# Patient Record
Sex: Female | Born: 1990 | Race: White | Hispanic: No | Marital: Married | Smoking: Never smoker
Health system: Southern US, Community
[De-identification: ages and names within clinical notes are randomized; demographics above are authoritative.]

## PROBLEM LIST (undated history)

## (undated) DIAGNOSIS — G43829 Menstrual migraine, not intractable, without status migrainosus: Secondary | ICD-10-CM

## (undated) DIAGNOSIS — Q0700 Arnold-Chiari syndrome without spina bifida or hydrocephalus: Secondary | ICD-10-CM

## (undated) DIAGNOSIS — D696 Thrombocytopenia, unspecified: Secondary | ICD-10-CM

## (undated) DIAGNOSIS — E221 Hyperprolactinemia: Secondary | ICD-10-CM

## (undated) DIAGNOSIS — I10 Essential (primary) hypertension: Secondary | ICD-10-CM

## (undated) DIAGNOSIS — O99119 Other diseases of the blood and blood-forming organs and certain disorders involving the immune mechanism complicating pregnancy, unspecified trimester: Secondary | ICD-10-CM

## (undated) DIAGNOSIS — F419 Anxiety disorder, unspecified: Secondary | ICD-10-CM

## (undated) HISTORY — DX: Thrombocytopenia, unspecified: O99.119

## (undated) HISTORY — DX: Essential (primary) hypertension: I10

## (undated) HISTORY — DX: Anxiety disorder, unspecified: F41.9

## (undated) HISTORY — DX: Thrombocytopenia, unspecified: D69.6

## (undated) HISTORY — DX: Menstrual migraine, not intractable, without status migrainosus: G43.829

## (undated) HISTORY — DX: Hyperprolactinemia: E22.1

## (undated) HISTORY — DX: Other diseases of the blood and blood-forming organs and certain disorders involving the immune mechanism complicating pregnancy, unspecified trimester: D69.6

## (undated) HISTORY — PX: NO PAST SURGERIES: SHX2092

## (undated) HISTORY — DX: Arnold-Chiari syndrome without spina bifida or hydrocephalus: Q07.00

---

## 2013-01-21 DIAGNOSIS — R609 Edema, unspecified: Secondary | ICD-10-CM

## 2016-02-23 LAB — OB RESULTS CONSOLE GC/CHLAMYDIA
CHLAMYDIA, DNA PROBE: NEGATIVE
Gonorrhea: NEGATIVE

## 2016-04-13 LAB — OB RESULTS CONSOLE RUBELLA ANTIBODY, IGM: Rubella: IMMUNE

## 2016-04-13 LAB — OB RESULTS CONSOLE HIV ANTIBODY (ROUTINE TESTING): HIV: NONREACTIVE

## 2016-04-13 LAB — OB RESULTS CONSOLE HEPATITIS B SURFACE ANTIGEN: Hepatitis B Surface Ag: NEGATIVE

## 2016-04-13 LAB — OB RESULTS CONSOLE RPR: RPR: NONREACTIVE

## 2016-07-16 NOTE — L&D Delivery Note (Signed)
Delivery Note After controlled pushing, at crowing vaginal internal bleeding was noted. Perineal band was getting thicker instead of thinning, I was concerned about vaginal sulcus tear due to very tight introitus not stretching with pushing. Reviewed episiotomy and agrees. Local infiltrated and 2 cm incision made midline and perineal support continued and baby delivered with next push.  At 7:11 PM a viable and healthy female was delivered via Vaginal, Spontaneous Delivery (Presentation:OA ).  APGAR: 7, 8; weight 7 lb 13 oz (3545 g).   Placenta status: complete and spontaneous.  Cord:  with the following complications: loose nuchal x1, reduced after head delivery. Cord pH: N/A  Anesthesia:  Epidural and Pudendal block.  Episiotomy: Median (no extension) Lacerations: Right vaginal to Sulcus and left Vaginal 3 cm./  Suture Repair: 3.0 vicryl rapide Est. Blood Loss (mL): 400  Mom to postpartum.  Baby to Couplet care / Skin to Skin.  Shaquitta Burbridge R 10/10/2016, 7:58 PM

## 2016-10-05 LAB — OB RESULTS CONSOLE GBS: STREP GROUP B AG: NEGATIVE

## 2016-10-10 ENCOUNTER — Inpatient Hospital Stay (HOSPITAL_COMMUNITY): Payer: 59 | Admitting: Anesthesiology

## 2016-10-10 ENCOUNTER — Encounter (HOSPITAL_COMMUNITY): Payer: Self-pay

## 2016-10-10 ENCOUNTER — Inpatient Hospital Stay (HOSPITAL_COMMUNITY)
Admission: AD | Admit: 2016-10-10 | Discharge: 2016-10-13 | DRG: 775 | Disposition: A | Discharge status: Home / Self Care | Payer: 59 | Source: Ambulatory Visit | Attending: Obstetrics & Gynecology | Admitting: Obstetrics & Gynecology

## 2016-10-10 DIAGNOSIS — D6959 Other secondary thrombocytopenia: Secondary | ICD-10-CM | POA: Diagnosis present

## 2016-10-10 DIAGNOSIS — D62 Acute posthemorrhagic anemia: Secondary | ICD-10-CM | POA: Diagnosis not present

## 2016-10-10 DIAGNOSIS — O9912 Other diseases of the blood and blood-forming organs and certain disorders involving the immune mechanism complicating childbirth: Secondary | ICD-10-CM | POA: Diagnosis present

## 2016-10-10 DIAGNOSIS — O139 Gestational [pregnancy-induced] hypertension without significant proteinuria, unspecified trimester: Secondary | ICD-10-CM | POA: Diagnosis present

## 2016-10-10 DIAGNOSIS — O9081 Anemia of the puerperium: Secondary | ICD-10-CM | POA: Diagnosis not present

## 2016-10-10 DIAGNOSIS — O1404 Mild to moderate pre-eclampsia, complicating childbirth: Secondary | ICD-10-CM | POA: Diagnosis present

## 2016-10-10 DIAGNOSIS — Z3A36 36 weeks gestation of pregnancy: Secondary | ICD-10-CM

## 2016-10-10 DIAGNOSIS — J069 Acute upper respiratory infection, unspecified: Secondary | ICD-10-CM | POA: Diagnosis present

## 2016-10-10 DIAGNOSIS — D696 Thrombocytopenia, unspecified: Secondary | ICD-10-CM | POA: Diagnosis present

## 2016-10-10 DIAGNOSIS — O4290 Premature rupture of membranes, unspecified as to length of time between rupture and onset of labor, unspecified weeks of gestation: Secondary | ICD-10-CM | POA: Diagnosis present

## 2016-10-10 DIAGNOSIS — O9952 Diseases of the respiratory system complicating childbirth: Secondary | ICD-10-CM | POA: Diagnosis present

## 2016-10-10 DIAGNOSIS — O42913 Preterm premature rupture of membranes, unspecified as to length of time between rupture and onset of labor, third trimester: Secondary | ICD-10-CM | POA: Diagnosis present

## 2016-10-10 LAB — CBC
HCT: 37.4 % (ref 36.0–46.0)
HCT: 34.7 % — ABNORMAL LOW (ref 36.0–46.0)
HCT: 34.5 % — ABNORMAL LOW (ref 36.0–46.0)
Hemoglobin: 13 g/dL (ref 12.0–15.0)
Hemoglobin: 12.2 g/dL (ref 12.0–15.0)
Hemoglobin: 12.1 g/dL (ref 12.0–15.0)
MCH: 29.7 pg (ref 26.0–34.0)
MCH: 30 pg (ref 26.0–34.0)
MCH: 29.9 pg (ref 26.0–34.0)
MCHC: 34.8 g/dL (ref 30.0–36.0)
MCHC: 35.2 g/dL (ref 30.0–36.0)
MCHC: 35.1 g/dL (ref 30.0–36.0)
MCV: 85.6 fL (ref 78.0–100.0)
MCV: 85.5 fL (ref 78.0–100.0)
MCV: 85.2 fL (ref 78.0–100.0)
PLATELETS: 114 10*3/uL — AB (ref 150–400)
Platelets: 116 10*3/uL — ABNORMAL LOW (ref 150–400)
Platelets: 128 10*3/uL — ABNORMAL LOW (ref 150–400)
RBC: 4.37 MIL/uL (ref 3.87–5.11)
RBC: 4.06 MIL/uL (ref 3.87–5.11)
RBC: 4.05 MIL/uL (ref 3.87–5.11)
RDW: 14.5 % (ref 11.5–15.5)
RDW: 14.4 % (ref 11.5–15.5)
RDW: 14.5 % (ref 11.5–15.5)
WBC: 8.3 10*3/uL (ref 4.0–10.5)
WBC: 9.3 10*3/uL (ref 4.0–10.5)
WBC: 17.6 10*3/uL — AB (ref 4.0–10.5)

## 2016-10-10 LAB — POCT FERN TEST: POCT FERN TEST: POSITIVE

## 2016-10-10 LAB — COMPREHENSIVE METABOLIC PANEL
ALBUMIN: 2.9 g/dL — AB (ref 3.5–5.0)
ALK PHOS: 107 U/L (ref 38–126)
ALT: 15 U/L (ref 14–54)
ALT: 18 U/L (ref 14–54)
AST: 22 U/L (ref 15–41)
AST: 54 U/L — AB (ref 15–41)
Albumin: 2.5 g/dL — ABNORMAL LOW (ref 3.5–5.0)
Alkaline Phosphatase: 89 U/L (ref 38–126)
Anion gap: 15 (ref 5–15)
Anion gap: 10 (ref 5–15)
BUN: 9 mg/dL (ref 6–20)
BUN: 6 mg/dL (ref 6–20)
CALCIUM: 9 mg/dL (ref 8.9–10.3)
CHLORIDE: 108 mmol/L (ref 101–111)
CHLORIDE: 109 mmol/L (ref 101–111)
CO2: 16 mmol/L — ABNORMAL LOW (ref 22–32)
CO2: 20 mmol/L — AB (ref 22–32)
Calcium: 8.5 mg/dL — ABNORMAL LOW (ref 8.9–10.3)
Creatinine, Ser: 0.56 mg/dL (ref 0.44–1.00)
Creatinine, Ser: 0.75 mg/dL (ref 0.44–1.00)
GFR calc non Af Amer: >60 mL/min (ref >60)
GLUCOSE: 77 mg/dL (ref 65–99)
Glucose, Bld: 116 mg/dL — ABNORMAL HIGH (ref 65–99)
POTASSIUM: 3.1 mmol/L — AB (ref 3.5–5.1)
Potassium: 3.5 mmol/L (ref 3.5–5.1)
SODIUM: 139 mmol/L (ref 135–145)
Sodium: 139 mmol/L (ref 135–145)
Total Bilirubin: 0.6 mg/dL (ref 0.3–1.2)
Total Bilirubin: 0.5 mg/dL (ref 0.3–1.2)
Total Protein: 6.2 g/dL — ABNORMAL LOW (ref 6.5–8.1)
Total Protein: 5.8 g/dL — ABNORMAL LOW (ref 6.5–8.1)

## 2016-10-10 LAB — ABO/RH: ABO/RH(D): O POS

## 2016-10-10 LAB — URIC ACID: Uric Acid, Serum: 6.1 mg/dL (ref 2.3–6.6)

## 2016-10-10 LAB — RPR: RPR Ser Ql: NONREACTIVE

## 2016-10-10 LAB — TYPE AND SCREEN
ABO/RH(D): O POS
ANTIBODY SCREEN: NEGATIVE

## 2016-10-10 MED ORDER — SIMETHICONE 80 MG PO CHEW: 80.0000 mg | CHEWABLE_TABLET | ORAL | Status: DC | PRN

## 2016-10-10 MED ORDER — LACTATED RINGERS IV SOLN
INTRAVENOUS | Status: DC
  Administered 2016-10-10 (×2): via INTRAVENOUS

## 2016-10-10 MED ORDER — TERBUTALINE SULFATE 1 MG/ML IJ SOLN: 0.2500 mg | Freq: Once | INTRAMUSCULAR | Status: DC | PRN

## 2016-10-10 MED ORDER — ACETAMINOPHEN 325 MG PO TABS
650.0000 mg | ORAL_TABLET | ORAL | Status: DC | PRN
  Administered 2016-10-12 (×3): 650 mg via ORAL
  Filled 2016-10-10 (×4): qty 2

## 2016-10-10 MED ORDER — BENZOCAINE-MENTHOL 20-0.5 % EX AERO
1.0000 "application " | INHALATION_SPRAY | CUTANEOUS | Status: DC | PRN
  Filled 2016-10-10: qty 56

## 2016-10-10 MED ORDER — FENTANYL CITRATE (PF) 100 MCG/2ML IJ SOLN
100.0000 ug | Freq: Once | INTRAMUSCULAR | Status: DC
  Filled 2016-10-10: qty 2

## 2016-10-10 MED ORDER — FENTANYL 2.5 MCG/ML BUPIVACAINE 1/10 % EPIDURAL INFUSION (WH - ANES)
14.0000 mL/h | INTRAMUSCULAR | Status: DC | PRN
  Administered 2016-10-10: 14 mL/h via EPIDURAL
  Filled 2016-10-10: qty 100

## 2016-10-10 MED ORDER — GUAIFENESIN-CODEINE 100-10 MG/5ML PO SOLN
5.0000 mL | Freq: Four times a day (QID) | ORAL | Status: DC | PRN
Start: 2016-10-10 — End: 2016-10-12
  Administered 2016-10-10 – 2016-10-11 (×3): 5 mL via ORAL
  Filled 2016-10-10 (×3): qty 5

## 2016-10-10 MED ORDER — OXYCODONE-ACETAMINOPHEN 5-325 MG PO TABS
1.0000 | ORAL_TABLET | ORAL | Status: DC | PRN
  Administered 2016-10-10: 1 via ORAL
  Filled 2016-10-10: qty 1

## 2016-10-10 MED ORDER — PHENYLEPHRINE 40 MCG/ML (10ML) SYRINGE FOR IV PUSH (FOR BLOOD PRESSURE SUPPORT)
80.0000 ug | PREFILLED_SYRINGE | INTRAVENOUS | Status: DC | PRN
  Filled 2016-10-10: qty 10

## 2016-10-10 MED ORDER — ONDANSETRON HCL 4 MG/2ML IJ SOLN: 4.0000 mg | INTRAMUSCULAR | Status: DC | PRN

## 2016-10-10 MED ORDER — ONDANSETRON HCL 4 MG PO TABS: 4.0000 mg | ORAL_TABLET | ORAL | Status: DC | PRN

## 2016-10-10 MED ORDER — ACETAMINOPHEN 325 MG PO TABS: 650.0000 mg | ORAL_TABLET | ORAL | Status: DC | PRN

## 2016-10-10 MED ORDER — EPHEDRINE 5 MG/ML INJ: 10.0000 mg | INTRAVENOUS | Status: DC | PRN

## 2016-10-10 MED ORDER — SOD CITRATE-CITRIC ACID 500-334 MG/5ML PO SOLN: 30.0000 mL | ORAL | Status: DC | PRN

## 2016-10-10 MED ORDER — OXYTOCIN BOLUS FROM INFUSION
500.0000 mL | Freq: Once | INTRAVENOUS | Status: AC
  Administered 2016-10-10: 500 mL via INTRAVENOUS

## 2016-10-10 MED ORDER — IRON 90 (18 FE) MG PO TABS: ORAL_TABLET | Freq: Every day | ORAL | Status: DC

## 2016-10-10 MED ORDER — LACTATED RINGERS IV SOLN: 500.0000 mL | INTRAVENOUS | Status: DC | PRN

## 2016-10-10 MED ORDER — OXYTOCIN 40 UNITS IN LACTATED RINGERS INFUSION - SIMPLE MED: 2.5000 [IU]/h | INTRAVENOUS | Status: DC

## 2016-10-10 MED ORDER — MISOPROSTOL 25 MCG QUARTER TABLET
25.0000 ug | ORAL_TABLET | Freq: Once | ORAL | Status: AC
  Administered 2016-10-10: 25 ug via BUCCAL
  Filled 2016-10-10: qty 1

## 2016-10-10 MED ORDER — FENTANYL CITRATE (PF) 100 MCG/2ML IJ SOLN: 100.0000 ug | Freq: Once | INTRAMUSCULAR | Status: DC

## 2016-10-10 MED ORDER — TETANUS-DIPHTH-ACELL PERTUSSIS 5-2.5-18.5 LF-MCG/0.5 IM SUSP
0.5000 mL | Freq: Once | INTRAMUSCULAR | Status: DC
  Filled 2016-10-10: qty 0.5

## 2016-10-10 MED ORDER — DIBUCAINE 1 % RE OINT: 1.0000 "application " | TOPICAL_OINTMENT | RECTAL | Status: DC | PRN

## 2016-10-10 MED ORDER — LIDOCAINE HCL (PF) 1 % IJ SOLN
INTRAMUSCULAR | Status: DC | PRN
  Administered 2016-10-10 (×2): 5 mL via EPIDURAL

## 2016-10-10 MED ORDER — FERROUS SULFATE 325 (65 FE) MG PO TABS
325.0000 mg | ORAL_TABLET | Freq: Two times a day (BID) | ORAL | Status: DC
  Administered 2016-10-11 – 2016-10-13 (×5): 325 mg via ORAL
  Filled 2016-10-10 (×5): qty 1

## 2016-10-10 MED ORDER — BETAMETHASONE SOD PHOS & ACET 6 (3-3) MG/ML IJ SUSP
12.0000 mg | INTRAMUSCULAR | Status: DC
  Administered 2016-10-10: 12 mg via INTRAMUSCULAR
  Filled 2016-10-10 (×2): qty 2

## 2016-10-10 MED ORDER — LACTATED RINGERS IV SOLN
500.0000 mL | Freq: Once | INTRAVENOUS | Status: AC
  Administered 2016-10-10: 500 mL via INTRAVENOUS

## 2016-10-10 MED ORDER — ONDANSETRON HCL 4 MG/2ML IJ SOLN: 4.0000 mg | Freq: Four times a day (QID) | INTRAMUSCULAR | Status: DC | PRN

## 2016-10-10 MED ORDER — PRENATAL MULTIVITAMIN CH
1.0000 | ORAL_TABLET | Freq: Every day | ORAL | Status: DC
  Administered 2016-10-11 – 2016-10-12 (×2): 1 via ORAL
  Filled 2016-10-10 (×2): qty 1

## 2016-10-10 MED ORDER — DIPHENHYDRAMINE HCL 25 MG PO CAPS: 25.0000 mg | ORAL_CAPSULE | Freq: Four times a day (QID) | ORAL | Status: DC | PRN

## 2016-10-10 MED ORDER — OXYTOCIN 40 UNITS IN LACTATED RINGERS INFUSION - SIMPLE MED
1.0000 m[IU]/min | INTRAVENOUS | Status: DC
  Administered 2016-10-10: 2 m[IU]/min via INTRAVENOUS
  Filled 2016-10-10: qty 1000

## 2016-10-10 MED ORDER — DIPHENHYDRAMINE HCL 50 MG/ML IJ SOLN: 12.5000 mg | INTRAMUSCULAR | Status: DC | PRN

## 2016-10-10 MED ORDER — FLEET ENEMA 7-19 GM/118ML RE ENEM: 1.0000 | ENEMA | RECTAL | Status: DC | PRN

## 2016-10-10 MED ORDER — COCONUT OIL OIL: 1.0000 "application " | TOPICAL_OIL | Status: DC | PRN

## 2016-10-10 MED ORDER — WITCH HAZEL-GLYCERIN EX PADS: 1.0000 "application " | MEDICATED_PAD | CUTANEOUS | Status: DC | PRN

## 2016-10-10 MED ORDER — SENNOSIDES-DOCUSATE SODIUM 8.6-50 MG PO TABS
2.0000 | ORAL_TABLET | ORAL | Status: DC
  Administered 2016-10-10 – 2016-10-12 (×3): 2 via ORAL
  Filled 2016-10-10 (×3): qty 2

## 2016-10-10 MED ORDER — IBUPROFEN 600 MG PO TABS
600.0000 mg | ORAL_TABLET | Freq: Four times a day (QID) | ORAL | Status: DC
  Administered 2016-10-10 – 2016-10-12 (×6): 600 mg via ORAL
  Filled 2016-10-10 (×7): qty 1

## 2016-10-10 MED ORDER — PHENYLEPHRINE 40 MCG/ML (10ML) SYRINGE FOR IV PUSH (FOR BLOOD PRESSURE SUPPORT): 80.0000 ug | PREFILLED_SYRINGE | INTRAVENOUS | Status: DC | PRN

## 2016-10-10 MED ORDER — LIDOCAINE HCL (PF) 1 % IJ SOLN
30.0000 mL | INTRAMUSCULAR | Status: AC | PRN
  Administered 2016-10-10 (×2): 30 mL via SUBCUTANEOUS
  Filled 2016-10-10 (×2): qty 30

## 2016-10-10 MED ORDER — OXYCODONE-ACETAMINOPHEN 5-325 MG PO TABS: 2.0000 | ORAL_TABLET | ORAL | Status: DC | PRN

## 2016-10-10 NOTE — Progress Notes (Signed)
Nurse at bedside for infant.  Nurse explained to pt that due to infant's low blood sugar the need for supplementation and oral glucose.  Nurse also reinforced need to do skin to skin with infant to assist with body temp and blood sugar.

## 2016-10-10 NOTE — Anesthesia Pain Management Evaluation Note (Signed)
  CRNA Pain Management Visit Note  Patient: Glenda Marsh, 26 y.o., female  "Hello I am a member of the anesthesia team at [facility]. We have an anesthesia team available at all times to provide care throughout the hospital, including epidural management and anesthesia for C-section. I don't know your plan for the delivery whether it a natural birth, water birth, IV sedation, nitrous supplementation, doula or epidural, but we want to meet your pain goals."   1.Was your pain managed to your expectations on prior hospitalizations?   No prior hospitalizations  2.What is your expectation for pain management during this hospitalization?     Epidural  3.How can we help you reach that goal? Epidural when I reach my pain goal.  Record the patient's initial score and the patient's pain goal.   Pain: 6  Pain Goal: 8 [facility] wants you to be able to say your pain was always managed very well.  [name] 10/10/2016

## 2016-10-10 NOTE — Anesthesia Procedure Notes (Signed)
Epidural Patient location during procedure: OB Start time: 10/10/2016 11:05 AM End time: 10/10/2016 11:14 AM  Staffing Anesthesiologist: Duane Boston Performed: anesthesiologist   Preanesthetic Checklist Completed: patient identified, site marked, pre-op evaluation, timeout performed, IV checked, risks and benefits discussed and monitors and equipment checked  Epidural Patient position: sitting Prep: DuraPrep Patient monitoring: heart rate, cardiac monitor, continuous pulse ox and blood pressure Approach: midline Location: L2-L3 Injection technique: LOR saline  Needle:  Needle type: Tuohy  Needle gauge: 17 G Needle length: 9 cm Needle insertion depth: 7 cm Catheter size: 20 Guage Catheter at skin depth: 11 cm Test dose: negative and Other  Assessment Events: blood not aspirated, injection not painful, no injection resistance and negative IV test  Additional Notes Informed consent obtained prior to proceeding including risk of failure, 1% risk of PDPH, risk of minor discomfort and bruising.  Discussed rare but serious complications including epidural abscess, permanent nerve injury, epidural hematoma.  Discussed alternatives to epidural analgesia and patient desires to proceed.  Timeout performed pre-procedure verifying patient name, procedure, and platelet count.  Patient tolerated procedure well.

## 2016-10-10 NOTE — MAU Note (Signed)
Vertex presentation confirmed by K. Booker CNM with bedside u/s

## 2016-10-10 NOTE — Progress Notes (Addendum)
Notified of pt arrival in MAU with SROM and report given of elevated blood pressure. Will have MAU provider scan patient for presentation and not check cervix since patient is comfortable. MD ordered betamethasone and routine labor admission orders. MD will look up GBS result and call back.

## 2016-10-10 NOTE — Anesthesia Preprocedure Evaluation (Signed)
Anesthesia Evaluation  Patient identified by MRN, date of birth, ID band Patient awake    Reviewed: Allergy & Precautions, NPO status , Patient's Chart, lab work & pertinent test results  History of Anesthesia Complications Negative for: history of anesthetic complications  Airway Mallampati: II  TM Distance: >3 FB Neck ROM: Full    Dental no notable dental hx. (+) Dental Advisory Given   Pulmonary neg pulmonary ROS,    Pulmonary exam normal        Cardiovascular negative cardio ROS Normal cardiovascular exam     Neuro/Psych negative neurological ROS  negative psych ROS   GI/Hepatic negative GI ROS, Neg liver ROS,   Endo/Other  negative endocrine ROS  Renal/GU negative Renal ROS  negative genitourinary   Musculoskeletal negative musculoskeletal ROS (+)   Abdominal   Peds negative pediatric ROS (+)  Hematology negative hematology ROS (+)   Anesthesia Other Findings   Reproductive/Obstetrics negative OB ROS                             Anesthesia Physical Anesthesia Plan  ASA: II  Anesthesia Plan: Epidural   Post-op Pain Management:    Induction:   Airway Management Planned: Natural Airway  Additional Equipment:   Intra-op Plan:   Post-operative Plan:   Informed Consent: I have reviewed the patients History and Physical, chart, labs and discussed the procedure including the risks, benefits and alternatives for the proposed anesthesia with the patient or authorized representative who has indicated his/her understanding and acceptance.   Dental advisory given  Plan Discussed with: Anesthesiologist  Anesthesia Plan Comments:         Anesthesia Quick Evaluation

## 2016-10-10 NOTE — Progress Notes (Signed)
S: Doing well, no complaints, pain well controlled as not yet contracting. No HA, no vision change  O: BP (!) 148/94   Pulse (!) 108   Temp 98 F (36.7 C) (Oral)   Resp 18   Ht 5\' 5"  (1.651 m)   Wt 83.5 kg (184 lb)   BMI 30.62 kg/m   PE: Vitals:   10/10/16 0330 10/10/16 0335 10/10/16 0346 10/10/16 0501  BP: (!) 158/93 (!) 150/94 (!) 148/94   Pulse: (!) 114 100 (!) 108   Resp: 18     Temp: 98 F (36.7 C)     TempSrc: Oral     Weight:    83.5 kg (184 lb)  Height:    5\' 5"  (1.651 m)   Gen: well appearing, no distress Abd: no RUQ pain, gravid, EFW 7.5# LE: 3+ edema, NT, 1+ DTR, no clonus GU: deferred by me, gross ROM in MAU, LDR nurse checked cvx prior to cytotec  FHT:  FHR: 150s bpm, variability: moderate,  accelerations:  Present,  decelerations:  Absent UC:   irritibility SVE:   Dilation: Fingertip Effacement (%): Thick Station: -2 Exam by:: Tia Masker, RN  RTUS in MAU: vtx  CMP     Component Value Date/Time   NA 139 10/10/2016 0410   K 3.5 10/10/2016 0410   CL 108 10/10/2016 0410   CO2 16 (L) 10/10/2016 0410   GLUCOSE 77 10/10/2016 0410   BUN 9 10/10/2016 0410   CREATININE 0.56 10/10/2016 0410   CALCIUM 9.0 10/10/2016 0410   PROT 6.2 (L) 10/10/2016 0410   ALBUMIN 2.9 (L) 10/10/2016 0410   AST 22 10/10/2016 0410   ALT 15 10/10/2016 0410   ALKPHOS 107 10/10/2016 0410   BILITOT 0.6 10/10/2016 0410   GFRNONAA >60 10/10/2016 0410   GFRAA >60 10/10/2016 0410   CBC    Component Value Date/Time   WBC 8.3 10/10/2016 0410   RBC 4.37 10/10/2016 0410   HGB 13.0 10/10/2016 0410   HCT 37.4 10/10/2016 0410   PLT 116 (L) 10/10/2016 0410   MCV 85.6 10/10/2016 0410   MCH 29.7 10/10/2016 0410   MCHC 34.8 10/10/2016 0410   RDW 14.5 10/10/2016 0410     A / P:  26 y.o.  Obstetric History   G1   P0   T0   P0   A0   L0    SAB0   TAB0   Ectopic0   Multiple0   Live Births0    at [redacted]w[redacted]d Preterm PROM. IOL. cytotec bucally x 1, will start pitocin after that.  Avoid cervical checks unless pt getting active. BMZ given Thrombocytopenia. Likely gestational on low baseline  Fetal Wellbeing:  Category I Pain Control:  Labor support without medications. Planning epidural  Anticipated MOD:  NSVD  Rosemarie Galvis A. 10/10/2016, 6:49 AM

## 2016-10-10 NOTE — H&P (Signed)
Glenda Marsh is a 26 y.o. G1P0 at [redacted]w[redacted]d presenting for ROM. Pt notes rare contractions. Good fetal movement, No vaginal bleeding, large leaking fluid.  [facility] at [place] since first trimester. - dated by 1st trimester u/s - h/o htn in past, nl bp's in pregnancy until 32 wks, slight elevations noted since then, no meds, labs for PEC negative.  - Gestational thrombocytopenia, last plt 102, starting plat 135 - elevated DS- 164, nl 3 hr - 36 wk u/s: 6'14, 90%, AC 99%  Prenatal Transfer Tool  Maternal Diabetes: No Genetic Screening: Declined Maternal Ultrasounds/Referrals: Normal Fetal Ultrasounds or other Referrals:  None Maternal Substance Abuse:  No Significant Maternal Medications:  None Significant Maternal Lab Results: None     OB History    Gravida Para Term Preterm AB Living   1             SAB TAB Ectopic Multiple Live Births                 History reviewed. No pertinent past medical history. History reviewed. No pertinent surgical history. Family History: family history is not on file. Social History:  reports that she has never smoked. She has never used smokeless tobacco. Her alcohol and drug histories are not on file.  Review of Systems - Negative except leaking fluid     Blood pressure (!) 148/94, pulse (!) 108, temperature 98 F (36.7 C), temperature source Oral, resp. rate 18.  Physical Exam:  Prenatal labs: ABO, Rh:   O+ Antibody:  neg Rubella: !Error! immune RPR:   NR HBsAg:   neg HIV:   neg GBS:   neg 1 hr Glucola 164, nl 3 hr  Genetic screening declined, nl AFP Anatomy US normal   Assessment/Plan: 26 y.o. G1P0 at [redacted]w[redacted]d ROM. Plan IOL. Will plan cytotec then pitocin Early GA, BMZ now GBS neg   Louretta Tantillo A. 10/10/2016, 4:40 AM

## 2016-10-10 NOTE — Progress Notes (Signed)
S/p epidural, fetal decels resolved  S: Doing well, no complaints, pain well controlled with epidural  O: BP 121/62   Pulse (!) 112   Temp 99.4 F (37.4 C) (Axillary)   Resp 18   Ht 5\' 5"  (1.651 m)   Wt 184 lb (83.5 kg)   SpO2 100%   BMI 30.62 kg/m   FHT 140s/ no decels/ + accels/ mod variab- cat I again  UC:   Spontaneous some coupling and some q 3 min SVE:   cx 5/90%/-2/ Vtx  A/P: G1, [redacted]w[redacted]d , labor AOL, IOL after preterm PROM, good progress, will hold pitocin for now to see if pt continues to make change on her own.  Hypertension, stable, no evidence PEC Low platelets, stable  Fetal Wellbeing:  Category I Pain Control:  Epidural  Anticipated MOD:  NSVD  Glenda Marsh R 10/10/2016, 2:15 PM

## 2016-10-10 NOTE — MAU Note (Signed)
Pt presents stating her water broke at 0230 with copious amounts of clear fluid. Clothes saturated on arrival. Denies bleeding. Reports decreased fetal movement since SROM. Has had some elevated BP in the office. Denies HA or visual changes.

## 2016-10-10 NOTE — Progress Notes (Signed)
Nurse at bedside to go over late preterm care with parents.  Mom states "I don't have a breast pump."  Nurse explained that hospital had one for use here and how to obtain one once at home.  Pt states " I'm not sure if I want to breast feed."  Nurse explained importance of doing skin to skin with infant to maintain body temperature and blood sugar.  Parents v/u.

## 2016-10-10 NOTE — Progress Notes (Signed)
CTSP for decel  S: Doing well, no complaints, pain well controlled with epidural  O: BP 116/60   Pulse (!) 114   Temp 98.4 F (36.9 C) (Oral)   Resp 18   Ht 5\' 5"  (1.651 m)   Wt 83.5 kg (184 lb)   SpO2 100%   BMI 30.62 kg/m    FHT:  FHR: 140s bpm, variability: moderate,  accelerations:  Present,  decelerations:  Present single 6 minute decels to 70s, after lower bp after epidural, responded to position change, phenylephrine and O2 UC:   hyperstim, q 1 min on 63munit/min of pitocin, now not tracing well SVE:   Dilation: 5 Effacement (%): 80 Station: -1 Exam by:: J.Thornton, RN    A / P:  26 y.o.  Obstetric History   G1   P0   T0   P0   A0   L0    SAB0   TAB0   Ectopic0   Multiple0   Live Births0    at [redacted]w[redacted]d IOL after preterm PROM, good progress, will hold pitocin for now to see if pt continues to make change on her own.  Hypertension, stable, no evidence PEC Low platelets, stable  Fetal Wellbeing:  Category I Pain Control:  Epidural  Anticipated MOD:  NSVD  Rehana Uncapher A. 10/10/2016, 12:12 PM

## 2016-10-11 LAB — CBC
HCT: 30.3 % — ABNORMAL LOW (ref 36.0–46.0)
Hemoglobin: 10.6 g/dL — ABNORMAL LOW (ref 12.0–15.0)
MCH: 30.2 pg (ref 26.0–34.0)
MCHC: 35 g/dL (ref 30.0–36.0)
MCV: 86.3 fL (ref 78.0–100.0)
Platelets: 116 K/uL — ABNORMAL LOW (ref 150–400)
RBC: 3.51 MIL/uL — ABNORMAL LOW (ref 3.87–5.11)
RDW: 14.7 % (ref 11.5–15.5)
WBC: 13.7 K/uL — ABNORMAL HIGH (ref 4.0–10.5)

## 2016-10-11 MED ORDER — HYDROXYZINE HCL 25 MG PO TABS
25.0000 mg | ORAL_TABLET | Freq: Once | ORAL | Status: AC
  Administered 2016-10-11: 25 mg via ORAL
  Filled 2016-10-11: qty 1

## 2016-10-11 MED ORDER — LABETALOL HCL 200 MG PO TABS
200.0000 mg | ORAL_TABLET | Freq: Two times a day (BID) | ORAL | Status: DC
  Administered 2016-10-11 – 2016-10-12 (×3): 200 mg via ORAL
  Filled 2016-10-11 (×3): qty 1

## 2016-10-11 MED ORDER — HYDROCHLOROTHIAZIDE 12.5 MG PO CAPS
12.5000 mg | ORAL_CAPSULE | Freq: Every day | ORAL | Status: DC
  Administered 2016-10-11 – 2016-10-13 (×3): 12.5 mg via ORAL
  Filled 2016-10-11 (×3): qty 1

## 2016-10-11 MED ORDER — POTASSIUM CHLORIDE CRYS ER 20 MEQ PO TBCR
40.0000 meq | EXTENDED_RELEASE_TABLET | Freq: Every day | ORAL | Status: AC
  Administered 2016-10-11 – 2016-10-12 (×2): 40 meq via ORAL
  Filled 2016-10-11 (×2): qty 2

## 2016-10-11 NOTE — Progress Notes (Signed)
Post Partum Day 1, preterm SVD, Boy.  Maternal gestational thrombocytopenia   Subjective: up ad lib, voiding and tolerating PO Baby had few vomiting/ choking bouts from formula feeding, was getting more per their instructions but better after pt started giving less. Desires to breast feed but does not want to get pressured.  Not slept much since labor started. Denies much perineal pain. Able to void well.  Denies HA/ vision changes/ RUQ pain.   Objective: Blood pressure 131/76, pulse (!) 103, temperature 98.2 F (36.8 C), temperature source Oral, resp. rate 16, height 5\' 5"  (1.651 m), weight 184 lb (83.5 kg), SpO2 100 %, unknown if currently breastfeeding.  BP elevated, range 120 - 161 / 60- 91  Patient Vitals for the past 24 hrs:  BP Temp Temp src Pulse Resp SpO2  10/11/16 0211 131/76 98.2 F (36.8 C) Oral (!) 103 16 -  10/10/16 2220 (!) 156/87 98.8 F (37.1 C) Oral (!) 125 16 -  10/10/16 2123 (!) 152/90 98 F (36.7 C) Oral - 14 -  10/10/16 2046 (!) 156/83 - - (!) 129 18 -  10/10/16 2033 (!) 155/85 - - (!) 125 18 -  10/10/16 2031 (!) 160/84 - - (!) 127 16 -  10/10/16 2021 (!) 158/91 - - (!) 129 16 -  10/10/16 2016 (!) 161/79 - - (!) 139 16 -  10/10/16 2001 (!) 156/85 - - (!) 130 18 -  10/10/16 1953 (!) 145/91 - - (!) 134 18 -  10/10/16 1946 (!) 142/77 98.8 F (37.1 C) Oral (!) 125 18 -  10/10/16 1931 (!) 147/77 - - (!) 130 20 -  10/10/16 1928 (!) 150/64 - - (!) 131 20 -  10/10/16 1833 (!) 157/81 99.1 F (37.3 C) Oral (!) 144 20 -  10/10/16 1831 (!) 157/81 - - (!) 144 20 -  10/10/16 1731 (!) 148/60 - - (!) 140 18 -  10/10/16 1530 125/81 - - (!) 127 20 -  10/10/16 1323 - 99.4 F (37.4 C) Axillary - - -  10/10/16 1301 121/62 - - (!) 112 - -  10/10/16 1231 123/66 - - (!) 114 - -  10/10/16 1201 116/60 - - (!) 114 18 -  10/10/16 1156 127/84 - - (!) 113 18 -  10/10/16 1151 114/70 - - (!) 120 18 -  10/10/16 1146 124/68 - - (!) 103 - -  10/10/16 1131 (!) 150/84 98.4 F (36.9 C)  Oral (!) 117 20 -  10/10/16 1108 138/90 - - (!) 116 20 100 %  10/10/16 1003 - - - - 18 -   Physical Exam:  General: alert and cooperative Lochia: appropriate Uterine Fundus: firm Incision: no dehiscence DVT Evaluation: No evidence of DVT seen on physical exam. Swelling LE ++   CBC Latest Ref Rng & Units 10/11/2016 10/10/2016 10/10/2016  WBC 4.0 - 10.5 K/uL 13.7(H) 17.6(H) 9.3  Hemoglobin 12.0 - 15.0 g/dL 10.6(L) 12.1 12.2  Hematocrit 36.0 - 46.0 % 30.3(L) 34.5(L) 34.7(L)  Platelets 150 - 400 K/uL 116(L) 128(L) 114(L)   CMP Latest Ref Rng & Units 10/10/2016 10/10/2016  Glucose 65 - 99 mg/dL 116(H) 77  BUN 6 - 20 mg/dL 6 9  Creatinine 0.44 - 1.00 mg/dL 0.75 0.56  Sodium 135 - 145 mmol/L 139 139  Potassium 3.5 - 5.1 mmol/L 3.1(L) 3.5  Chloride 101 - 111 mmol/L 109 108  CO2 22 - 32 mmol/L 20(L) 16(L)  Calcium 8.9 - 10.3 mg/dL 8.5(L) 9.0  Total Protein 6.5 -  8.1 g/dL 5.8(L) 6.2(L)  Total Bilirubin 0.3 - 1.2 mg/dL 0.5 0.6  Alkaline Phos 38 - 126 U/L 89 107  AST 15 - 41 U/L 54(H) 22  ALT 14 - 54 U/L 18 15   Assessment/Plan: PPD day 1. Preterm SVD after PROM, maternal GHTN, maternal gest.thrombocytopenia, BOY 7'13"  Breastfeeding, Lactation consult and Circumcision prior to discharge Rh positive, Rub Immune. S/p TDAP  GHTN- BP now improving, start HCTZ 12.5 mg x 5 days due to severe edema and since patient doesn't have preeclampsia Reassess BP daily and plan office f/up 1 wk Low platelets, stable. Repeat CMP Anemia - on iron 2nd degree perineal lac, bilateral vag lacerations, repaired well. Peri-care.    LOS: 1 day   Josi Roediger R 10/11/2016, 9:25 AM

## 2016-10-11 NOTE — Anesthesia Postprocedure Evaluation (Signed)
Anesthesia Post Note  Patient: Glenda Marsh  Procedure(s) Performed: * No procedures listed *  Patient location during evaluation: Mother Baby Anesthesia Type: Epidural Level of consciousness: awake and alert Pain management: satisfactory to patient Vital Signs Assessment: post-procedure vital signs reviewed and stable Respiratory status: respiratory function stable Cardiovascular status: stable Postop Assessment: no headache, no backache, epidural receding, patient able to bend at knees, no signs of nausea or vomiting and adequate PO intake Anesthetic complications: no        Last Vitals:  Vitals:   10/10/16 2220 10/11/16 0211  BP: (!) 156/87 131/76  Pulse: (!) 125 (!) 103  Resp: 16 16  Temp: 37.1 C 36.8 C    Last Pain:  Vitals:   10/11/16 0700  TempSrc:   PainSc: 0-No pain   Pain Goal:                 Daisuke Bailey

## 2016-10-11 NOTE — Progress Notes (Signed)
Pt BP/HR elevated with no c/o of headaches or dizziness. Pt has non productive cough requesting cough meds. Called MD about elevated BP/HR. MD ordered cough med and RN was told to monitor BP to call for BP of 160/110. Will continue to monitor. Lynett Fish, RN

## 2016-10-11 NOTE — Progress Notes (Signed)
Patient ID: Glenda Marsh, female   DOB: [birth year], 26 y.o.   MRN: [id number] Chart review noted BP of 193/90 at 4 pm and no further BPs taken or reported to MD or CNM on call.  Spoke with RN, advised repeat BP needed and not just wait 2 hrs to repeat after such high BP.  She called back with BP 153/80s.  Recommend call MD for >160/100.  Adding Labetalol 200mg  bid for GHTN, with postpartum worsening.  Pt already on HCTZ 12.5 mg starting today to reduce preload from severe edema and postpartum changes

## 2016-10-11 NOTE — Lactation Note (Signed)
This note was copied from a baby's chart. Lactation Consultation Note  Patient Name: Glenda Marsh [id number] Date: 10/11/2016   Per York Cerise RN mother chooses to not breastfeed and only wants to formula feed.     Maternal Data    Feeding    LATCH Score/Interventions                      Lactation Tools Discussed/Used     Consult Status      Carlye Grippe 10/11/2016, 11:57 AM

## 2016-10-12 DIAGNOSIS — D696 Thrombocytopenia, unspecified: Secondary | ICD-10-CM | POA: Diagnosis present

## 2016-10-12 DIAGNOSIS — O139 Gestational [pregnancy-induced] hypertension without significant proteinuria, unspecified trimester: Secondary | ICD-10-CM | POA: Diagnosis present

## 2016-10-12 LAB — CBC
HEMATOCRIT: 27.6 % — AB (ref 36.0–46.0)
HEMATOCRIT: 27.9 % — AB (ref 36.0–46.0)
Hemoglobin: 9.7 g/dL — ABNORMAL LOW (ref 12.0–15.0)
Hemoglobin: 9.6 g/dL — ABNORMAL LOW (ref 12.0–15.0)
MCH: 30.5 pg (ref 26.0–34.0)
MCH: 30 pg (ref 26.0–34.0)
MCHC: 35.1 g/dL (ref 30.0–36.0)
MCHC: 34.4 g/dL (ref 30.0–36.0)
MCV: 86.8 fL (ref 78.0–100.0)
MCV: 87.2 fL (ref 78.0–100.0)
Platelets: 92 10*3/uL — ABNORMAL LOW (ref 150–400)
Platelets: 101 10*3/uL — ABNORMAL LOW (ref 150–400)
RBC: 3.18 MIL/uL — AB (ref 3.87–5.11)
RBC: 3.2 MIL/uL — ABNORMAL LOW (ref 3.87–5.11)
RDW: 14.7 % (ref 11.5–15.5)
RDW: 14.8 % (ref 11.5–15.5)
WBC: 10.2 10*3/uL (ref 4.0–10.5)
WBC: 9.4 10*3/uL (ref 4.0–10.5)

## 2016-10-12 LAB — COMPREHENSIVE METABOLIC PANEL
ALBUMIN: 2.1 g/dL — AB (ref 3.5–5.0)
ALT: 35 U/L (ref 14–54)
ALT: 39 U/L (ref 14–54)
ANION GAP: 7 (ref 5–15)
AST: 73 U/L — ABNORMAL HIGH (ref 15–41)
AST: 67 U/L — ABNORMAL HIGH (ref 15–41)
Albumin: 2.2 g/dL — ABNORMAL LOW (ref 3.5–5.0)
Alkaline Phosphatase: 66 U/L (ref 38–126)
Alkaline Phosphatase: 69 U/L (ref 38–126)
Anion gap: 5 (ref 5–15)
BILIRUBIN TOTAL: 0.1 mg/dL — AB (ref 0.3–1.2)
BILIRUBIN TOTAL: 0.4 mg/dL (ref 0.3–1.2)
BUN: 9 mg/dL (ref 6–20)
BUN: 10 mg/dL (ref 6–20)
CHLORIDE: 110 mmol/L (ref 101–111)
CHLORIDE: 107 mmol/L (ref 101–111)
CO2: 23 mmol/L (ref 22–32)
CO2: 23 mmol/L (ref 22–32)
Calcium: 8.3 mg/dL — ABNORMAL LOW (ref 8.9–10.3)
Calcium: 8 mg/dL — ABNORMAL LOW (ref 8.9–10.3)
Creatinine, Ser: 0.64 mg/dL (ref 0.44–1.00)
Creatinine, Ser: 0.67 mg/dL (ref 0.44–1.00)
GFR calc Af Amer: >60 mL/min (ref >60)
GFR calc non Af Amer: >60 mL/min (ref >60)
GLUCOSE: 83 mg/dL (ref 65–99)
GLUCOSE: 108 mg/dL — AB (ref 65–99)
POTASSIUM: 4.6 mmol/L (ref 3.5–5.1)
POTASSIUM: 3.9 mmol/L (ref 3.5–5.1)
Sodium: 138 mmol/L (ref 135–145)
Sodium: 137 mmol/L (ref 135–145)
TOTAL PROTEIN: 4.8 g/dL — AB (ref 6.5–8.1)
Total Protein: 4.9 g/dL — ABNORMAL LOW (ref 6.5–8.1)

## 2016-10-12 MED ORDER — SALINE SPRAY 0.65 % NA SOLN
1.0000 | NASAL | Status: DC | PRN
  Administered 2016-10-12: 1 via NASAL
  Filled 2016-10-12: qty 44

## 2016-10-12 MED ORDER — GUAIFENESIN 100 MG/5ML PO SOLN
15.0000 mL | ORAL | Status: DC | PRN
  Administered 2016-10-12 – 2016-10-13 (×2): 300 mg via ORAL
  Filled 2016-10-12 (×2): qty 15

## 2016-10-12 MED ORDER — GUAIFENESIN 100 MG/5ML PO SOLN
5.0000 mL | ORAL | Status: DC | PRN
  Administered 2016-10-12: 17:00:00 via ORAL
  Administered 2016-10-12: 300 mg via ORAL
  Filled 2016-10-12 (×2): qty 15

## 2016-10-12 NOTE — Progress Notes (Signed)
Post Partum Day #2           Information for the patient's newborn:  Dazani, Norby [[id number]]  female  circumcision planned outpatient Feeding: bottle - formula  Subjective: No HA/RUQ pain/NV/visual changes, SOB, CP, F/C. Pain minimal. Normal vaginal bleeding, no clots.    URI symptoms w/ post-nasal drainage and cough.    Objective:  VS:  Vitals:   10/11/16 1700 10/11/16 2300 10/12/16 0041 10/12/16 0500  BP: (!) 152/82 (!) 148/98 124/73 140/85  Pulse:  88 81 93  Resp:   20 18  Temp:   98 F (36.7 C) 98.2 F (36.8 C)  TempSrc:   Oral Oral  SpO2:      Weight:      Height:        No intake or output data in the 24 hours ending 10/12/16 0934    CBC Latest Ref Rng & Units 10/12/2016 10/11/2016 10/10/2016  WBC 4.0 - 10.5 K/uL 10.2 13.7(H) 17.6(H)  Hemoglobin 12.0 - 15.0 g/dL 9.7(L) 10.6(L) 12.1  Hematocrit 36.0 - 46.0 % 27.6(L) 30.3(L) 34.5(L)  Platelets 150 - 400 K/uL 92(L) 116(L) 128(L)   Hepatic Function Latest Ref Rng & Units 10/12/2016 10/10/2016 10/10/2016  Total Protein 6.5 - 8.1 g/dL 4.8(L) 5.8(L) 6.2(L)  Albumin 3.5 - 5.0 g/dL 2.1(L) 2.5(L) 2.9(L)  AST 15 - 41 U/L 73(H) 54(H) 22  ALT 14 - 54 U/L 35 18 15  Alk Phosphatase 38 - 126 U/L 66 89 107  Total Bilirubin 0.3 - 1.2 mg/dL 0.1(L) 0.5 0.6   Blood type: --/--/O POS, O POS (03/28 0410) Rubella: Immune (09/29 0000)    Physical Exam:  General: alert, cooperative and no distress Uterine Fundus: firm Lochia: appropriate Perineum: repair intact, edema minimal DVT Evaluation: No cords or calf tenderness. Calf/Ankle edema is present.    Assessment/Plan: PPD # 2 / 26 y.o., G1P0101 S/P:spontaneous vaginal   Principal Problem:   Postpartum care following vaginal delivery (3/28) Active Problems:   Preterm delivery   Thrombocytopenia ([place])   Gestational hypertension  - no neural s/s, BP controlled on Labetalol and HCTZ  - LFT's elevated today and plts small drop. Will monitor closely for HELLP, rpt CMP and  CBC at 1500, consider  Mag Sulphate prophylaxis if LFT's trending up.   - Hold Motrin for now  - Strict I&O  - continue inpatient obs  URI  - saline spray PRN  - robitussin for cough  POC discussed w. Dr. Dellis Filbert    LOS: 2 days   Juliene Pina, CNM, MSN 10/12/2016, 9:34 AM

## 2016-10-13 MED ORDER — IBUPROFEN 600 MG PO TABS: 600.0000 mg | ORAL_TABLET | Freq: Four times a day (QID) | ORAL | 0 refills | Status: DC

## 2016-10-13 MED ORDER — POLYSACCHARIDE IRON COMPLEX 150 MG PO CAPS: 150.0000 mg | ORAL_CAPSULE | Freq: Every day | ORAL | 0 refills | Status: DC

## 2016-10-13 MED ORDER — HYDROCHLOROTHIAZIDE 12.5 MG PO CAPS: 12.5000 mg | ORAL_CAPSULE | Freq: Every day | ORAL | 0 refills | Status: DC

## 2016-10-13 MED ORDER — MAGNESIUM OXIDE 400 (241.3 MG) MG PO TABS: 400.0000 mg | ORAL_TABLET | Freq: Every day | ORAL | 0 refills | Status: DC

## 2016-10-13 MED ORDER — LABETALOL HCL 200 MG PO TABS
200.0000 mg | ORAL_TABLET | Freq: Three times a day (TID) | ORAL | Status: DC
  Administered 2016-10-13: 200 mg via ORAL
  Filled 2016-10-13: qty 1

## 2016-10-13 MED ORDER — LABETALOL HCL 200 MG PO TABS: 200.0000 mg | ORAL_TABLET | Freq: Three times a day (TID) | ORAL | 1 refills | Status: DC

## 2016-10-13 NOTE — Discharge Summary (Signed)
Obstetric Discharge Summary Reason for Admission: onset of labor Prenatal Procedures: ultrasound and serial labs Intrapartum Procedures: spontaneous vaginal delivery with median episiotomy Postpartum Procedures: none Complications-Operative and Postpartum: mild PEC -labs stable with improvement during hospitalization Hemoglobin  Date Value Ref Range Status  10/12/2016 9.6 (L) 12.0 - 15.0 g/dL Final   HCT  Date Value Ref Range Status  10/12/2016 27.9 (L) 36.0 - 46.0 % Final    Physical Exam:  General: alert, cooperative and no distress Lochia: appropriate Uterine Fundus: firm Incision: healing well DVT Evaluation: No evidence of DVT seen on physical exam.  Discharge Diagnoses: Term Pregnancy-delivered / mild PEC / ABL anemia  Discharge Information: Date: 10/13/2016 Activity: pelvic rest Diet: routine Medications: PNV, Ibuprofen, Iron and labetalol 200TID / HCTZ 12.5 / magnesium 400 Condition: stable Instructions: refer to practice specific booklet Discharge to: home Follow-up Burn[place], MD. Schedule an appointment as soon as possible for a visit in 1 week(s).   Specialty:  Obstetrics and Gynecology Contact information: [place] [address] [phone]           Newborn Data: Live born female  Birth Weight: 7 lb 13 oz (3545 g) APGAR: 7, 8  Home with mother.  Glenda Marsh 10/13/2016, 10:46 AM

## 2016-10-13 NOTE — Progress Notes (Signed)
PPD 3 SVD  S:  Reports feeling better today - denies PIH signs               Cough since admission with congestion             Tolerating po/ No nausea or vomiting             Bleeding is light             Pain controlled with motrin             Up ad lib / ambulatory / voiding QS  Newborn breast feeding   O:               VS: BP (!) 150/90   Pulse 80   Temp 98.7 F (37.1 C) (Oral)   Resp 19   Ht 5\' 5"  (1.651 m)   Wt 83.5 kg (184 lb)   SpO2 100%   Breastfeeding? Unknown   BMI 30.62 kg/m    BP 134/77 - 150/94   LABS:  SGOT 67 (73) / SGPT 39 (35) creatinine 0.67            Recent Labs  10/12/16 0513 10/12/16 1438  WBC 10.2 9.4  HGB 9.7* 9.6*  PLT 92* 101*               Blood type: --/--/O POS, O POS (03/28 0410)  Rubella: Immune (09/29 0000)                     I&O net negative 5270              Physical Exam:             Alert and oriented X3  Lungs: Clear and unlabored  Heart: regular rate and rhythm / no mumurs  Abdomen: soft, non-tender, non-distended              Fundus: firm, non-tender, U-1  Perineum: no edema  Lochia: light  Extremities: 1+ edema, no calf pain or tenderness    A: PPD # 3 / mild PEC - improving labs & stable BP              ABL anemia  Doing well - stable status  P: Routine post partum orders  DC home - OV this week for BP recheck                  Labetalol 200 BID to TID / continue HCTZ  Artelia Laroche CNM, MSN, [facility] 10/13/2016, 9:44 AM

## 2018-07-14 ENCOUNTER — Other Ambulatory Visit: Payer: Self-pay | Admitting: Obstetrics & Gynecology

## 2018-07-14 DIAGNOSIS — E221 Hyperprolactinemia: Secondary | ICD-10-CM

## 2018-07-28 ENCOUNTER — Ambulatory Visit
Admission: RE | Admit: 2018-07-28 | Discharge: 2018-07-28 | Disposition: A | Discharge status: Home / Self Care | Payer: 59 | Source: Ambulatory Visit | Attending: Obstetrics & Gynecology | Admitting: Obstetrics & Gynecology

## 2018-07-28 DIAGNOSIS — E221 Hyperprolactinemia: Secondary | ICD-10-CM

## 2018-07-28 MED ORDER — GADOBENATE DIMEGLUMINE 529 MG/ML IV SOLN
7.0000 mL | Freq: Once | INTRAVENOUS | Status: AC | PRN
  Administered 2018-07-28: 7 mL via INTRAVENOUS

## 2018-09-25 ENCOUNTER — Ambulatory Visit: Payer: 59 | Admitting: Neurology

## 2018-10-29 ENCOUNTER — Encounter: Payer: Self-pay | Admitting: *Deleted

## 2018-10-30 ENCOUNTER — Encounter: Payer: Self-pay | Admitting: *Deleted

## 2018-10-30 ENCOUNTER — Ambulatory Visit (INDEPENDENT_AMBULATORY_CARE_PROVIDER_SITE_OTHER): Payer: 59 | Admitting: Neurology

## 2018-10-30 ENCOUNTER — Other Ambulatory Visit: Payer: Self-pay

## 2018-10-30 DIAGNOSIS — Q0700 Arnold-Chiari syndrome without spina bifida or hydrocephalus: Secondary | ICD-10-CM | POA: Insufficient documentation

## 2018-10-30 DIAGNOSIS — R9089 Other abnormal findings on diagnostic imaging of central nervous system: Secondary | ICD-10-CM | POA: Insufficient documentation

## 2018-10-30 DIAGNOSIS — Q2730 Arteriovenous malformation, site unspecified: Secondary | ICD-10-CM

## 2018-10-30 DIAGNOSIS — G43709 Chronic migraine without aura, not intractable, without status migrainosus: Secondary | ICD-10-CM | POA: Diagnosis not present

## 2018-10-30 DIAGNOSIS — IMO0002 Reserved for concepts with insufficient information to code with codable children: Secondary | ICD-10-CM | POA: Insufficient documentation

## 2018-10-30 NOTE — Progress Notes (Signed)
PATIENT: Glenda Marsh DOB: July 20, 1990  Virtual Visit via Video  I connected with Laban Emperor Muma on 10/30/18 at  By video and verified that I am speaking with the correct person using two identifiers.   I discussed the limitations, risks, security and privacy concerns of performing an evaluation and management service by video and the availability of in person appointments. I also discussed with the patient that there may be a patient responsible charge related to this service. The patient expressed understanding and agreed to proceed.  HISTORICAL  Glenda Marsh is a 28 year old female seen in request by her primary care physician Azucena Fallen for evaluation of chronic migraine, abnormal MRIs,  I have reviewed and summarized the referring note from the referring physician.  She had past medical history of anxiety, taking Zoloft, under good control, also reported history of gestational thrombocytopenia, has improved as well, she missed her menstruation cycle for 6 months, work-up demonstrated elevated prolactin level, she was seen by [facility] medicine, was treated with carbergoline since February 2020, per patient, most recent repeat prolactin level was within normal limits  For evaluation, she received MRI of the brain with without contrast, I personally reviewed the film, there was very low and pointed cerebellar tonsils 2.5 cm below the foramen magnum, there is also descent of the maxilla with mild darkening, there is posterior tilting of the dens, hypoplastic appearing occipital condyles, there is borderline basilar invagination, no visible syrinx, no hydrocephalus,  Patient reported long history of migraine headaches, since elementary school, it is usually triggered by straining, blowing her nose, always located at the left posterior occipital region, lasting 3 to 4 hours, with mild light noise sensitivity, nauseous, there was no significant change of migraine over the past few years,  it happened about couple times each months, responsive to ibuprofen,  She did have uneventful pregnancy and delivery of a healthy boy 2 years ago.  She denies visual loss, no vertigo, no hearing loss, no lateralized motor or sensory deficit, no gait abnormality  Observations/Objective: I have reviewed problem lists, medications, allergies. Awake alert oriented to history taking care of conversation.  Assessment and Plan: Chronic migraine headaches Abnormal MRI of the brain  Evidence of Arnold-Chiari malformation, low-lying cerebellar tonsil 2.5 cm below foramen magnum, also descent of the maxilla with mild buckling, posterior tilting of the dens, borderline basilar invagination  Proceed with MRI of cervical spine to rule out syrinx,  Follow Up Instructions:  2 months    I discussed the assessment and treatment plan with the patient. The patient was provided an opportunity to ask questions and all were answered. The patient agreed with the plan and demonstrated an understanding of the instructions.   The patient was advised to call back or seek an in-person evaluation if the symptoms worsen or if the condition fails to improve as anticipated.  I provided 30 minutes of non-face-to-face time during this encounter.  REVIEW OF SYSTEMS: Full 14 system review of systems performed and notable only for as above All other review of systems were negative.  ALLERGIES: No Known Allergies  HOME MEDICATIONS: Current Outpatient Medications  Medication Sig Dispense Refill  . sertraline (ZOLOFT) 50 MG tablet Take 50 mg by mouth daily.      No current facility-administered medications for this visit.     PAST MEDICAL HISTORY: Past Medical History:  Diagnosis Date  . Anxiety   . Arnold-Chiari malformation ([place])   . Gestational thrombocytopenia ([place])   . Hyperprolactinemia (  [place])   . Hypertension   . Menstrual migraine     PAST SURGICAL HISTORY: Past Surgical History:  Procedure  Laterality Date  . NO PAST SURGERIES      FAMILY HISTORY: Family History  Problem Relation Age of Onset  . Diabetes Maternal Grandmother   . Hypertension Maternal Grandmother   . Healthy Mother   . Healthy Father   . Bladder Cancer Maternal Grandfather   . Diabetes Paternal Grandmother   . Heart attack Paternal Grandfather     SOCIAL HISTORY:   Social History   Socioeconomic History  . Marital status: Married    Spouse name: Not on file  . Number of children: 1  . Years of education: 10  . Highest education level: High school graduate  Occupational History  . Occupation: Occupational psychologist  Social Needs  . Financial resource strain: Not on file  . Food insecurity:    Worry: Not on file    Inability: Not on file  . Transportation needs:    Medical: Not on file    Non-medical: Not on file  Tobacco Use  . Smoking status: Never Smoker  . Smokeless tobacco: Never Used  Substance and Sexual Activity  . Alcohol use: Not Currently  . Drug use: Never  . Sexual activity: Not on file  Lifestyle  . Physical activity:    Days per week: Not on file    Minutes per session: Not on file  . Stress: Not on file  Relationships  . Social connections:    Talks on phone: Not on file    Gets together: Not on file    Attends religious service: Not on file    Active member of club or organization: Not on file    Attends meetings of clubs or organizations: Not on file    Relationship status: Not on file  . Intimate partner violence:    Fear of current or ex partner: Not on file    Emotionally abused: Not on file    Physically abused: Not on file    Forced sexual activity: Not on file  Other Topics Concern  . Not on file  Social History Narrative   Lives at home with husband and child.   Right-handed.   One cup of caffeine some days.    Marcial Pacas, M.D. Ph.D.  [facility] [address], [place], [place] [address] Ph: [phone] Fax: [phone]   CC: Azucena Fallen, MD

## 2018-10-31 ENCOUNTER — Encounter: Payer: Self-pay | Admitting: Neurology

## 2018-11-03 ENCOUNTER — Telehealth: Payer: Self-pay | Admitting: Neurology

## 2018-11-03 NOTE — Telephone Encounter (Signed)
Spoke with patient to schedule a 3 month follow-up after virtual visit per Dr. Krista Blue. Patient stated that she was at work and will call back to schedule.

## 2018-11-04 ENCOUNTER — Other Ambulatory Visit: Payer: Self-pay | Admitting: Neurology

## 2018-11-04 NOTE — Telephone Encounter (Signed)
[employer] Auth: [id number] (exp. 11/04/18 to 12/19/18) order sent to GI. They will reach out to the pt to schedule.

## 2018-11-06 MED ORDER — ALPRAZOLAM 1 MG PO TABS
ORAL_TABLET | ORAL | 0 refills | Status: DC
Start: 2018-11-06 — End: 2019-01-14

## 2018-11-06 NOTE — Telephone Encounter (Signed)
Per vo by Dr. Krista Blue, ok to provide Xanax 1mg , take 1-2 tablets thirty minutes prior to MRI.  May take one additional tablet before entering scanner, if needed.  MUST HAVE DRIVER.  I called the patient and confirmed that she has NKDA.  The prescription will be sent to [facility], per patient request.

## 2018-11-06 NOTE — Telephone Encounter (Signed)
Pt called in and wanted to know if something can be sent to her pharmacy before she has her mri due to her being claustrophobic

## 2018-11-06 NOTE — Addendum Note (Signed)
Addended by: Desmond Lope on: 11/06/2018 04:46 PM   Modules accepted: Orders

## 2018-11-18 ENCOUNTER — Other Ambulatory Visit: Payer: Self-pay

## 2018-11-18 ENCOUNTER — Ambulatory Visit
Admission: RE | Admit: 2018-11-18 | Discharge: 2018-11-18 | Disposition: A | Discharge status: Home / Self Care | Payer: 59 | Source: Ambulatory Visit | Attending: Neurology | Admitting: Neurology

## 2018-11-18 DIAGNOSIS — Q2730 Arteriovenous malformation, site unspecified: Secondary | ICD-10-CM | POA: Diagnosis not present

## 2018-11-19 ENCOUNTER — Telehealth: Payer: Self-pay | Admitting: Neurology

## 2018-11-19 NOTE — Telephone Encounter (Signed)
I was able to speak with the patient.  She is aware of her MRI results and verbalized understanding.

## 2018-11-19 NOTE — Telephone Encounter (Signed)
Please call patient MRI of cervicals spine showed similar findings as MRI brain, low lying Arnold-Chiari malformation, and a small central syrinx at C2-4, no change in treatment plan, keep app on February 05 2019. I will go over films with her.  IMPRESSION: Abnormal MRI scan cervical spine showing large Arnold-Chiari malformation with cerebellar tonsils protruding nearly 2.5 cm below the foramen magnum with also descent of the lower medulla with buckling of the dens and hypoplasia of occipital condyles and mild basilar invagination.  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  The intervertebral disc and rest of the spinal cord appear unremarkable.

## 2019-01-14 ENCOUNTER — Telehealth: Payer: Self-pay | Admitting: Neurology

## 2019-01-14 MED ORDER — SUMATRIPTAN SUCCINATE 50 MG PO TABS: ORAL_TABLET | ORAL | 11 refills | Status: DC

## 2019-01-14 MED ORDER — NORTRIPTYLINE HCL 10 MG PO CAPS: 10.0000 mg | ORAL_CAPSULE | Freq: Every day | ORAL | 11 refills | Status: DC

## 2019-01-14 NOTE — Telephone Encounter (Signed)
Ok, per Dr. Krista Blue, to take the nortriptyline and sumatriptan with her other medications.  She did direct the patient to take the sertraline in the morning and nortriptyline at night.  We also reviewed the sumatriptan instructions.  The patient verbalized understanding.

## 2019-01-14 NOTE — Telephone Encounter (Signed)
Please check with her how often does she have migraines.  May consider triptan treatment? I can call in ibuprofen 800mg  if it works well for her, but she should take it <= twice a week.  If she has more question, may set up a visit with NP Judson Roch

## 2019-01-14 NOTE — Addendum Note (Signed)
Addended by: Noberto Retort C on: 01/14/2019 05:01 PM   Modules accepted: Orders

## 2019-01-14 NOTE — Addendum Note (Signed)
Addended by: Noberto Retort C on: 01/14/2019 02:41 PM   Modules accepted: Orders

## 2019-01-14 NOTE — Telephone Encounter (Signed)
I have spoken with the patient.  She is getting headaches daily now.  She has NKDA.  Per vo by Dr. Krista Blue, offer the following prescriptions:  1) nortriptyline 10mg , one tablet at bedtime. 2) sumatriptan 25mg , Take 1 tab at onset of migraine.  May repeat in 2 hrs, if needed.  Max dose: 2 tabs/day. #12/month.   The patient is agreeable to try these medications.  She has provided me with the additional information that she is taking the following:  1) sertraline 50mg , one tablet daily 2) cabergoline 0.5mg . half tablet weekly.  Her medication list has been updated.

## 2019-01-14 NOTE — Telephone Encounter (Signed)
Pt is asking if 800mg  of Ibuprofen can be called in for her headaches.  Please send to [facility]

## 2019-02-05 ENCOUNTER — Other Ambulatory Visit: Payer: Self-pay

## 2019-02-05 ENCOUNTER — Ambulatory Visit: Payer: 59 | Admitting: Neurology

## 2019-02-05 ENCOUNTER — Encounter: Payer: Self-pay | Admitting: Neurology

## 2019-02-05 VITALS — BP 127/76 | HR 76 | Temp 98.2°F | Ht 65.0 in | Wt 169.5 lb

## 2019-02-05 DIAGNOSIS — Q0700 Arnold-Chiari syndrome without spina bifida or hydrocephalus: Secondary | ICD-10-CM

## 2019-02-05 NOTE — Progress Notes (Signed)
PATIENT: Glenda Marsh DOB: [birth year]  Chief Complaint  Patient presents with  . Migraine    She is here with her mother, Arrie Aran.  She has not started nortriptyline yet.  She has not needed to try the sumatriptan.  No recent migraines.  She has cut out caffeine completely.   . Abnormal MRI    She would like to review her cervical and brain MRI scans.      HISTORICAL  Glenda Marsh is a 28 year old female seen in request by her primary care physician Azucena Fallen for evaluation of chronic migraine, abnormal MRIs,  I have reviewed and summarized the referring note from the referring physician.  She had past medical history of anxiety, taking Zoloft, under good control, also reported history of gestational thrombocytopenia, has improved as well, she missed her menstruation cycle for 6 months, work-up demonstrated elevated prolactin level, she was seen by [facility] medicine, was treated with carbergoline since February 2020, per patient, most recent repeat prolactin level was within normal limits  For evaluation, she received MRI of the brain with without contrast, I personally reviewed the film, there was very low and pointed cerebellar tonsils 2.5 cm below the foramen magnum, there is also descent of the maxilla with mild darkening, there is posterior tilting of the dens, hypoplastic appearing occipital condyles, there is borderline basilar invagination, no visible syrinx, no hydrocephalus,  Patient reported long history of migraine headaches, since elementary school, it is usually triggered by straining, blowing her nose, always located at the left posterior occipital region, lasting 3 to 4 hours, with mild light noise sensitivity, nauseous, there was no significant change of migraine over the past few years, it happened about couple times each months, responsive to ibuprofen,  She did have uneventful pregnancy and delivery of a healthy boy 2 years ago.  She denies visual loss, no  vertigo, no hearing loss, no lateralized motor or sensory deficit, no gait abnormality  UPDATE February 05 2019: She is accompanied by her mother at today's clinical visit, she denies headache, not tried nortriptyline or Imitrex, denied gait abnormality, no dizziness, she is going through endocrinology work-up for her heavy menstruation every 2 weeks  We personally reviewed MRI of brain, no pituitary abnormality, Arnold-Chiari malformation, 2.5 cm below the foramen magnum  MRI of cervical spine protruding 2.5 cm below foramen magnum, with also descent of lower mid shoulder with bulking of the dens, and hypoplasia of occipital condyles, mid basilar invagination's, 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  ALLERGIES: No Known Allergies  HOME MEDICATIONS: Current Outpatient Medications  Medication Sig Dispense Refill  . cabergoline (DOSTINEX) 0.5 MG tablet Take 0.25 mg by mouth once a week.    . nortriptyline (PAMELOR) 10 MG capsule Take 1 capsule (10 mg total) by mouth at bedtime. 30 capsule 11  . sertraline (ZOLOFT) 50 MG tablet Take 50 mg by mouth daily.     . SUMAtriptan (IMITREX) 50 MG tablet Take 1 tab at onset of migraine.  May repeat in 2 hrs, if needed.  Max dose: 2 tabs/day. This is a 30 day prescription. 12 tablet 11   No current facility-administered medications for this visit.     PAST MEDICAL HISTORY: Past Medical History:  Diagnosis Date  . Anxiety   . Arnold-Chiari malformation ([place])   .  Gestational thrombocytopenia ([place])   . Hyperprolactinemia ([place])   . Hypertension   . Menstrual migraine     PAST SURGICAL HISTORY: Past Surgical History:  Procedure Laterality Date  . NO PAST SURGERIES      FAMILY HISTORY: Family History  Problem Relation Age of Onset  . Diabetes Maternal Grandmother   . Hypertension  Maternal Grandmother   . Healthy Mother   . Healthy Father   . Bladder Cancer Maternal Grandfather   . Diabetes Paternal Grandmother   . Heart attack Paternal Grandfather     SOCIAL HISTORY: Social History   Socioeconomic History  . Marital status: Married    Spouse name: Not on file  . Number of children: 1  . Years of education: 33  . Highest education level: High school graduate  Occupational History  . Occupation: Occupational psychologist  Social Needs  . Financial resource strain: Not on file  . Food insecurity    Worry: Not on file    Inability: Not on file  . Transportation needs    Medical: Not on file    Non-medical: Not on file  Tobacco Use  . Smoking status: Never Smoker  . Smokeless tobacco: Never Used  Substance and Sexual Activity  . Alcohol use: Not Currently  . Drug use: Never  . Sexual activity: Not on file  Lifestyle  . Physical activity    Days per week: Not on file    Minutes per session: Not on file  . Stress: Not on file  Relationships  . Social Herbalist on phone: Not on file    Gets together: Not on file    Attends religious service: Not on file    Active member of club or organization: Not on file    Attends meetings of clubs or organizations: Not on file    Relationship status: Not on file  . Intimate partner violence    Fear of current or ex partner: Not on file    Emotionally abused: Not on file    Physically abused: Not on file    Forced sexual activity: Not on file  Other Topics Concern  . Not on file  Social History Narrative   Lives at home with husband and child.   Right-handed.   No daily caffeine.     PHYSICAL EXAM   Vitals:   02/05/19 1508  BP: 127/76  Pulse: 76  Temp: 98.2 F (36.8 C)  Weight: 169 lb 8 oz (76.9 kg)  Height: 5\' 5"  (1.651 m)    Not recorded      Body mass index is 28.21 kg/m.  PHYSICAL EXAMNIATION:  Gen: NAD, conversant, well nourised, obese, well groomed                      Cardiovascular: Regular rate rhythm, no peripheral edema, warm, nontender. Eyes: Conjunctivae clear without exudates or hemorrhage Neck: Supple, no carotid bruits. Pulmonary: Clear to auscultation bilaterally   NEUROLOGICAL EXAM:  MENTAL STATUS: Speech:    Speech is normal; fluent and spontaneous with normal comprehension.  Cognition:     Orientation to time, place and person     Normal recent and remote memory     Normal Attention span and concentration     Normal Language, naming, repeating,spontaneous speech     Fund of knowledge   CRANIAL NERVES: CN II: Visual fields are full to confrontation. Fundoscopic exam is normal with sharp discs and no vascular changes.  Pupils are round equal and briskly reactive to light. CN III, IV, VI: extraocular movement are normal. No ptosis. CN V: Facial sensation is intact to pinprick in all 3 divisions bilaterally. Corneal responses are intact.  CN VII: Face is symmetric with normal eye closure and smile. CN VIII: Hearing is normal to rubbing fingers CN IX, X: Palate elevates symmetrically. Phonation is normal. CN XI: Head turning and shoulder shrug are intact CN XII: Tongue is midline with normal movements and no atrophy.  MOTOR: There is no pronator drift of out-stretched arms. Muscle bulk and tone are normal. Muscle strength is normal.  REFLEXES: Reflexes are 2+ and symmetric at the biceps, triceps, knees 3/3, and ankles. Plantar responses are flexor.  SENSORY: Intact to light touch, pinprick, positional sensation and vibratory sensation are intact in fingers and toes.  COORDINATION: Rapid alternating movements and fine finger movements are intact. There is no dysmetria on finger-to-nose and heel-knee-shin.    GAIT/STANCE: Posture is normal. Gait is steady with normal steps, base, arm swing, and turning. Heel and toe walking are normal. Tandem gait is normal.  Romberg is absent.   DIAGNOSTIC DATA (LABS, IMAGING, TESTING) - I  reviewed patient records, labs, notes, testing and imaging myself where available.   ASSESSMENT AND PLAN  MAKENZE ELLETT is a 28 y.o. female   Incidental finding of Arnold-Chiari malformation Chronic migraine headaches  There is no signs of upper cervical, brainstem compression,  Continue observe her symptoms     Marcial Pacas, M.D. Ph.D.  [facility] [address], [place], [place] [address] Ph: [phone] Fax: [phone]  CC: Referring Provider

## 2019-06-04 LAB — OB RESULTS CONSOLE RUBELLA ANTIBODY, IGM: Rubella: IMMUNE

## 2019-06-04 LAB — OB RESULTS CONSOLE GC/CHLAMYDIA
Chlamydia: NEGATIVE
Gonorrhea: NEGATIVE

## 2019-06-04 LAB — OB RESULTS CONSOLE RPR: RPR: NONREACTIVE

## 2019-06-04 LAB — OB RESULTS CONSOLE HIV ANTIBODY (ROUTINE TESTING): HIV: NONREACTIVE

## 2019-06-04 LAB — OB RESULTS CONSOLE HEPATITIS B SURFACE ANTIGEN: Hepatitis B Surface Ag: NEGATIVE

## 2019-07-17 NOTE — L&D Delivery Note (Signed)
Delivery Note At  2106 a viable and healthy female was delivered over intact perineum via svd  (Presentation: ROA, cephalic ;  ).  APGAR: 8/9, ; weight  not yet done.   Placenta status: delivered spontaneously, intact.  Cord: 3vv  with the following complications: none .  Anesthesia:  epidural Episiotomy:  none Lacerations:  2nd degree perineal Suture Repair: 3.0 vicryl Est. Blood Loss (mL):  354ml  Mom to postpartum.  Baby to Couplet care / Skin to Skin.  Charyl Bigger 12/03/2019, 9:50 PM

## 2019-10-23 ENCOUNTER — Telehealth: Payer: Self-pay | Admitting: Neurology

## 2019-10-23 NOTE — Telephone Encounter (Signed)
Pt has called re: Dr Krista Blue mentioning to her that if she had thought about having more children she would need to have a C-Section.  Pt is now 7 months into her pregnancy, her OB advised her to call to speak with Dr Krista Blue about what to do, pt is due in June.

## 2019-10-27 NOTE — Telephone Encounter (Signed)
Chiari I malformation and delivery Glenda Marsh,* Glenda Marsh, and Glenda Marsh. [place]  Manufacturing systems engineer See the article "Management of aneurysmal subarachnoid hemorrhage: State of the art and future perspectives", 11. Since 1995, we have been a part of two multidisciplinary centers focused on the diagnosis and treatment of Chiari I malformation (CMI). Because of the 3:1 female prevalence of CMI, and the fact that a large number of patients become symptomatic during their reproductive years, managing a CMI patient during pregnancy and delivery is not an uncommon scenario.  The following recommendations have been adopted and field tested on the patients of our centers for more than 10 years. They reflect the feedback from experts in the field of Obstetrics and Anesthesia, as well as the evolution of our understanding of CMI and syringomyelia.  A trial of natural childbirth is not contraindicated in patients with CMI (untreated, or after surgical decompression)  Epidural anesthesia, if necessary, should be performed with caution to avoid dural puncture (est. 1-2% incidence), which could potentially aggravate Chiari symptoms resulting from cerebrospinal fluid (CSF) leakage. In case of dural puncture, a blood patch should be performed immediately  Spinal anesthesia is not contraindicated, although the potential for anesthetic-related arachnoiditis is low, but unpredictable. In case of symptomatic CSF leak, a blood patch should be performed immediately  If vaginal delivery is anticipated to be difficult, prolonged, or complicated, the threshold for a C-section with general or spinal anesthetic should be lower in undecompressed and partially decompressed patients with Chiari I, when compared to that of the normal population  For general anesthesia, extreme and prolonged neck extension during intubation must be avoided  Anesthesiologists  should refrain from the use of anesthetic agents, such as fentanyl and ketamine, that may increase intracranial pressure  If a very large syringomyelia cavity is present at the beginning of the pregnancy, magnetic resonance imaging (MRI) should be obtained on the 25th week of gestation, and a follow-up with the treating neurosurgeon should follow. If the syringomyelia has become larger and/or if the patient has developed a new or worsening neurological deficit, consideration should be given for an early delivery at that point (25th week or later) followed by Chiari decompression. The same criteria will apply to syringobulbia  The most important dimension of a syringomyelia cavity is the cross diameter, not its length. A central Syrinx is problematic when the cross diameter is equal or more than 75% of the cross diameter of the spinal cord at that level. An exception to this rule is the case in which the Syrinx is asymmetric, with a side bleb rupturing in the cord parenchyma; this scenario invariably leads to a neurological deficit and represents an indication for urgent surgery regardless of its diameter  The possible presence of recognized CMI comorbidities (such as Ehlers-Danlos syndrome, craniocervical instability, Klippel-Feil anomaly, tethered cord, mast cell activation disorder, dysautonomia, hormonal imbalance, postural orthostatic tachycardia syndrome, idiopathic intracranial hypertension, intracranial hypotension, etc.) should be kept in mind when managing CMI patients in a delivery setting.  I called patient, related questions were answered, she is doing well, this is her second pregnancy, she had a normal vaginal delivery under epidural anesthesia in March 2018 without any complications.  I personally reviewed MRI of brain without contrast on July 28, 2018, Arnold-Chiari malformation 2.5 cm low pointed cerebellar tonsils below the foramen magnum, there also descends of the medulla with mild  darkening, but there is no syrinx, no hydrocephalus, no evidence of  compression.  Patient denies any abnormal symptoms.  Per above articles, she can still try vaginal delivery under epidural, but the threshold for C-section should be lower when compared to normal populations.  Epidural anesthesia if necessary should be performed with caution to avoid dural punctuation.

## 2019-10-27 NOTE — Telephone Encounter (Signed)
Pt has called because she has yet to hear back from Dr Krista Blue, please call.

## 2019-10-30 ENCOUNTER — Other Ambulatory Visit: Payer: Self-pay

## 2019-10-30 ENCOUNTER — Encounter (HOSPITAL_COMMUNITY): Payer: Self-pay | Admitting: Obstetrics & Gynecology

## 2019-10-30 ENCOUNTER — Inpatient Hospital Stay (HOSPITAL_COMMUNITY)
Admission: AD | Admit: 2019-10-30 | Discharge: 2019-10-30 | Disposition: A | Discharge status: Home / Self Care | Payer: 59 | Attending: Obstetrics & Gynecology | Admitting: Obstetrics & Gynecology

## 2019-10-30 DIAGNOSIS — O10913 Unspecified pre-existing hypertension complicating pregnancy, third trimester: Secondary | ICD-10-CM | POA: Insufficient documentation

## 2019-10-30 DIAGNOSIS — Z3689 Encounter for other specified antenatal screening: Secondary | ICD-10-CM

## 2019-10-30 DIAGNOSIS — Z0371 Encounter for suspected problem with amniotic cavity and membrane ruled out: Secondary | ICD-10-CM | POA: Diagnosis not present

## 2019-10-30 DIAGNOSIS — O42913 Preterm premature rupture of membranes, unspecified as to length of time between rupture and onset of labor, third trimester: Secondary | ICD-10-CM | POA: Insufficient documentation

## 2019-10-30 DIAGNOSIS — Z3A32 32 weeks gestation of pregnancy: Secondary | ICD-10-CM | POA: Diagnosis not present

## 2019-10-30 DIAGNOSIS — R030 Elevated blood-pressure reading, without diagnosis of hypertension: Secondary | ICD-10-CM

## 2019-10-30 DIAGNOSIS — B9689 Other specified bacterial agents as the cause of diseases classified elsewhere: Secondary | ICD-10-CM

## 2019-10-30 LAB — WET PREP, GENITAL
Sperm: NONE SEEN
Trich, Wet Prep: NONE SEEN
Yeast Wet Prep HPF POC: NONE SEEN

## 2019-10-30 LAB — POCT FERN TEST: POCT Fern Test: NEGATIVE

## 2019-10-30 LAB — AMNISURE RUPTURE OF MEMBRANE (ROM) NOT AT ARMC: Amnisure ROM: NEGATIVE

## 2019-10-30 LAB — PROTEIN / CREATININE RATIO, URINE
Creatinine, Urine: 96.84 mg/dL
Protein Creatinine Ratio: 0.12 mg/mg{Cre} (ref 0.00–0.15)
Total Protein, Urine: 12 mg/dL

## 2019-10-30 MED ORDER — METRONIDAZOLE 500 MG PO TABS: 500.0000 mg | ORAL_TABLET | Freq: Two times a day (BID) | ORAL | 0 refills | Status: DC

## 2019-10-30 NOTE — Discharge Instructions (Signed)
Bacterial Vaginosis  Bacterial vaginosis is a vaginal infection that occurs when the normal balance of bacteria in the vagina is disrupted. It results from an overgrowth of certain bacteria. This is the most common vaginal infection among women ages 15-44. Because bacterial vaginosis increases your risk for STIs (sexually transmitted infections), getting treated can help reduce your risk for chlamydia, gonorrhea, herpes, and HIV (human immunodeficiency virus). Treatment is also important for preventing complications in pregnant women, because this condition can cause an early (premature) delivery. What are the causes? This condition is caused by an increase in harmful bacteria that are normally present in small amounts in the vagina. However, the reason that the condition develops is not fully understood. What increases the risk? The following factors may make you more likely to develop this condition:  Having a new sexual partner or multiple sexual partners.  Having unprotected sex.  Douching.  Having an intrauterine device (IUD).  Smoking.  Drug and alcohol abuse.  Taking certain antibiotic medicines.  Being pregnant. You cannot get bacterial vaginosis from toilet seats, bedding, swimming pools, or contact with objects around you. What are the signs or symptoms? Symptoms of this condition include:  Grey or white vaginal discharge. The discharge can also be watery or foamy.  A fish-like odor with discharge, especially after sexual intercourse or during menstruation.  Itching in and around the vagina.  Burning or pain with urination. Some women with bacterial vaginosis have no signs or symptoms. How is this diagnosed? This condition is diagnosed based on:  Your medical history.  A physical exam of the vagina.  Testing a sample of vaginal fluid under a microscope to look for a large amount of bad bacteria or abnormal cells. Your health care provider may use a cotton swab or  a small wooden spatula to collect the sample. How is this treated? This condition is treated with antibiotics. These may be given as a pill, a vaginal cream, or a medicine that is put into the vagina (suppository). If the condition comes back after treatment, a second round of antibiotics may be needed. Follow these instructions at home: Medicines  Take over-the-counter and prescription medicines only as told by your health care provider.  Take or use your antibiotic as told by your health care provider. Do not stop taking or using the antibiotic even if you start to feel better. General instructions  If you have a female sexual partner, tell her that you have a vaginal infection. She should see her health care provider and be treated if she has symptoms. If you have a female sexual partner, he does not need treatment.  During treatment: ? Avoid sexual activity until you finish treatment. ? Do not douche. ? Avoid alcohol as directed by your health care provider. ? Avoid breastfeeding as directed by your health care provider.  Drink enough water and fluids to keep your urine clear or pale yellow.  Keep the area around your vagina and rectum clean. ? Wash the area daily with warm water. ? Wipe yourself from front to back after using the toilet.  Keep all follow-up visits as told by your health care provider. This is important. How is this prevented?  Do not douche.  Wash the outside of your vagina with warm water only.  Use protection when having sex. This includes latex condoms and dental dams.  Limit how many sexual partners you have. To help prevent bacterial vaginosis, it is best to have sex with just one partner (  monogamous).  Make sure you and your sexual partner are tested for STIs.  Wear cotton or cotton-lined underwear.  Avoid wearing tight pants and pantyhose, especially during summer.  Limit the amount of alcohol that you drink.  Do not use any products that contain  nicotine or tobacco, such as cigarettes and e-cigarettes. If you need help quitting, ask your health care provider.  Do not use illegal drugs. Where to find more information  Centers for Disease Control and Prevention: www.cdc.gov/std  American Sexual Health Association (ASHA): www.ashastd.org  U.S. Department of Health and Human Services, Office on Women's Health: www.womenshealth.gov/ or https://www.womenshealth.gov/a-z-topics/bacterial-vaginosis Contact a health care provider if:  Your symptoms do not improve, even after treatment.  You have more discharge or pain when urinating.  You have a fever.  You have pain in your abdomen.  You have pain during sex.  You have vaginal bleeding between periods. Summary  Bacterial vaginosis is a vaginal infection that occurs when the normal balance of bacteria in the vagina is disrupted.  Because bacterial vaginosis increases your risk for STIs (sexually transmitted infections), getting treated can help reduce your risk for chlamydia, gonorrhea, herpes, and HIV (human immunodeficiency virus). Treatment is also important for preventing complications in pregnant women, because the condition can cause an early (premature) delivery.  This condition is treated with antibiotic medicines. These may be given as a pill, a vaginal cream, or a medicine that is put into the vagina (suppository). This information is not intended to replace advice given to you by your health care provider. Make sure you discuss any questions you have with your health care provider. Document Revised: 06/14/2017 Document Reviewed: 03/17/2016 Elsevier Patient Education  2020 Elsevier Inc.  

## 2019-10-30 NOTE — MAU Provider Note (Signed)
History     CSN: [id number]  Arrival date and time: 10/30/19 2050   First Provider Initiated Contact with Patient 10/30/19 2133      Chief Complaint  Patient presents with  . Rupture of Membranes   MCCLAIN Glenda Marsh is a 29 y.o. G2P0101 at 107w4d who receives care at [facility].  She presents today for Rupture of Membranes.  She states she has noticed leaking since around 1pm.  She states she noticed more fluid prior to eating dinner tonight, but is unsure if it is urine.  Patient endorses fetal movement and denies abdominal cramping or contractions.  She reports some pelvic pressure and lower back discomfort, but states it is "nothing out of the ordinary."  She states she was having "normal" vaginal discharge prior to the leaking.  She states she did not wear a pad to MAU and her underwear was not wet.  She denies sexual activity in the last 3 days.      OB History    Gravida  2   Para  1   Term      Preterm  1   AB      Living  1     SAB      TAB      Ectopic      Multiple  0   Live Births  1           Past Medical History:  Diagnosis Date  . Anxiety   . Arnold-Chiari malformation ([place])   . Gestational thrombocytopenia ([place])   . Hyperprolactinemia ([place])   . Hypertension   . Menstrual migraine     Past Surgical History:  Procedure Laterality Date  . NO PAST SURGERIES      Family History  Problem Relation Age of Onset  . Diabetes Maternal Grandmother   . Hypertension Maternal Grandmother   . Healthy Mother   . Healthy Father   . Bladder Cancer Maternal Grandfather   . Diabetes Paternal Grandmother   . Heart attack Paternal Grandfather     Social History   Tobacco Use  . Smoking status: Never Smoker  . Smokeless tobacco: Never Used  Substance Use Topics  . Alcohol use: Not Currently  . Drug use: Never    Allergies: No Known Allergies  Medications Prior to Admission  Medication Sig Dispense Refill Last Dose  . aspirin EC 81 MG  tablet Take 81 mg by mouth daily.   10/30/2019 at Unknown time  . Prenatal Vit-Fe Fumarate-FA (PRENATAL MULTIVITAMIN) TABS tablet Take 1 tablet by mouth daily at 12 noon.   10/30/2019 at Unknown time  . cabergoline (DOSTINEX) 0.5 MG tablet Take 0.25 mg by mouth once a week.     . nortriptyline (PAMELOR) 10 MG capsule Take 1 capsule (10 mg total) by mouth at bedtime. 30 capsule 11   . sertraline (ZOLOFT) 50 MG tablet Take 50 mg by mouth daily.    More than a month at Unknown time  . SUMAtriptan (IMITREX) 50 MG tablet Take 1 tab at onset of migraine.  May repeat in 2 hrs, if needed.  Max dose: 2 tabs/day. This is a 30 day prescription. 12 tablet 11 not taking    Review of Systems  Constitutional: Negative for chills and fever.  Eyes: Negative for visual disturbance.  Respiratory: Negative for cough and shortness of breath.   Gastrointestinal: Negative for abdominal pain.  Genitourinary: Negative for difficulty urinating, dysuria and vaginal bleeding.  Musculoskeletal: Negative for  back pain.  Neurological: Negative for dizziness, light-headedness and headaches.   Physical Exam   Blood pressure (!) 143/67, pulse (!) 118, temperature 98.6 F (37 C), temperature source Oral, resp. rate 18, height 5\' 6"  (1.676 m), weight 87.6 kg, SpO2 100 %, unknown if currently breastfeeding. Vitals:   10/30/19 2104 10/30/19 2120  BP: (!) 143/86 (!) 143/67  Pulse: (!) 126 (!) 118  Resp: 20 18  Temp: 98.6 F (37 C)   TempSrc: Oral   SpO2:  100%  Weight: 87.6 kg   Height: 5\' 6"  (1.676 m)     Physical Exam  Constitutional: She is oriented to person, place, and time. She appears well-developed and well-nourished.  HENT:  Head: Normocephalic and atraumatic.  Eyes: Conjunctivae are normal.  Cardiovascular: Normal rate.  Respiratory: Effort normal.  Genitourinary: Cervix exhibits no motion tenderness, no discharge and no friability.    Vaginal discharge present.     No vaginal bleeding.  No bleeding in  the vagina.    Genitourinary Comments: Sterile Speculum Exam: -Normal External Genitalia: Non tender, appears dry and without apparent discharge or fluid noted at introitus.  -Vaginal Vault: Pink mucosa with mild vaginal wall prolapse. No pooling noted.  Small amt thin white discharge -wet prep, fern, and amnisure collected -Cervix:Pink, no lesions, cysts, or polyps.  Appears closed. No active bleeding or leaking from os-GC/CT collected -Bimanual Exam: Dilation: Closed Station: Ballotable Exam by:: Gavin Pound cnm    Musculoskeletal:        General: Normal range of motion.     Cervical back: Normal range of motion.  Neurological: She is alert and oriented to person, place, and time.  Skin: Skin is warm and dry.  Psychiatric: She has a normal mood and affect. Her behavior is normal.    Fetal Assessment 145 bpm, Mod Var, -Decels, +Accels Toco: Irregular, 2 noted in 15 minutes  MAU Course   Results for orders placed or performed during the hospital encounter of 10/30/19 (from the past 24 hour(s))  Wet prep, genital     Status: Abnormal   Collection Time: 10/30/19  9:53 PM   Specimen: Cervix  Result Value Ref Range   Yeast Wet Prep HPF POC NONE SEEN NONE SEEN   Trich, Wet Prep NONE SEEN NONE SEEN   Clue Cells Wet Prep HPF POC PRESENT (A) NONE SEEN   WBC, Wet Prep HPF POC MODERATE (A) NONE SEEN   Sperm NONE SEEN   Protein / creatinine ratio, urine     Status: None   Collection Time: 10/30/19  9:53 PM  Result Value Ref Range   Creatinine, Urine 96.84 mg/dL   Total Protein, Urine 12 mg/dL   Protein Creatinine Ratio 0.12 0.00 - 0.15 mg/mg[Cre]  Amnisure rupture of membrane (rom)not at [facility]     Status: None   Collection Time: 10/30/19 10:07 PM  Result Value Ref Range   Amnisure ROM NEGATIVE    No results found.  MDM PE Labs: Tonie Griffith prep, GC/CT, Amnisure EFM  Assessment and Plan  29 year old G2P0101  SIUP at 32.4 weeks Cat I FT Leaking of Fluid Mild Elevation in  BP H/O GHTN  -POC reviewed. -Exam performed and findings discussed. -Informed that exam not suggestive of ROM, but amnisure would be sent since c/o leaking started this afternoon.  -Discussed elevated blood pressures upon arrival. Patient reports that she was feeling very anxious.  -Reviewed PN records and baseline PC Ratio 0.17 on 10/15/2019. -Informed that PC Ratio  would be performed today and compared to previous findings. -Cultures collected and pending.  Maryann Alar reviewed and negative. -Will await other results.   Maryann Conners MSN, CNM 10/30/2019, 9:33 PM   Vitals:   10/30/19 2241 10/30/19 2301  BP: 130/71 130/71  Pulse: (!) 108 (!) 108  Resp:    Temp:    SpO2:      Reassessment (10:47 PM) Bacterial Vaginosis Amnisure Negative  -Results reviewed with patient. -Educated on bacterial vaginosis including what it is and how it is caused. -Given option for oral or vaginal therapy and opts for oral. -Rx for Flagyl sent to pharmacy on file.  -BP normotensive and PC Ratio without significance.  -Instructed to keep appt as scheduled for 11/10/2019 with primary ob. -Preterm labor precautions given. -Encouraged to call or return to MAU if symptoms worsen or with the onset of new symptoms. -Discharged to home in stable condition.  Maryann Conners MSN, CNM Advanced Practice Provider, [facility]

## 2019-10-30 NOTE — MAU Note (Signed)
PT SAYS AT 1PM- HAD LEAKING FLUID. THEN TONIGHT - HAD MORE FLUID COME OUT.  FEELS LOWER ABD PRESSURE  AND  BACK .  DR MODY- CALLED THIS EVENING - TOLD TO COME . LAST SEX- MARCH

## 2019-11-02 LAB — GC/CHLAMYDIA PROBE AMP (~~LOC~~) NOT AT ARMC
Chlamydia: NEGATIVE
Comment: NEGATIVE
Comment: NORMAL
Neisseria Gonorrhea: NEGATIVE

## 2019-12-02 ENCOUNTER — Other Ambulatory Visit: Payer: Self-pay

## 2019-12-02 ENCOUNTER — Inpatient Hospital Stay (HOSPITAL_COMMUNITY)
Admission: AD | Admit: 2019-12-02 | Discharge: 2019-12-05 | DRG: 806 | Disposition: A | Discharge status: Home / Self Care | Payer: 59 | Attending: Obstetrics and Gynecology | Admitting: Obstetrics and Gynecology

## 2019-12-02 ENCOUNTER — Encounter (HOSPITAL_COMMUNITY): Payer: Self-pay | Admitting: Obstetrics & Gynecology

## 2019-12-02 ENCOUNTER — Inpatient Hospital Stay (HOSPITAL_COMMUNITY): Admit: 2019-12-02 | Payer: 59 | Admitting: Obstetrics & Gynecology

## 2019-12-02 DIAGNOSIS — O322XX0 Maternal care for transverse and oblique lie, not applicable or unspecified: Secondary | ICD-10-CM | POA: Diagnosis present

## 2019-12-02 DIAGNOSIS — Z20822 Contact with and (suspected) exposure to covid-19: Secondary | ICD-10-CM | POA: Diagnosis present

## 2019-12-02 DIAGNOSIS — O9081 Anemia of the puerperium: Secondary | ICD-10-CM | POA: Diagnosis not present

## 2019-12-02 DIAGNOSIS — O139 Gestational [pregnancy-induced] hypertension without significant proteinuria, unspecified trimester: Secondary | ICD-10-CM | POA: Diagnosis present

## 2019-12-02 DIAGNOSIS — D62 Acute posthemorrhagic anemia: Secondary | ICD-10-CM | POA: Diagnosis not present

## 2019-12-02 DIAGNOSIS — O9912 Other diseases of the blood and blood-forming organs and certain disorders involving the immune mechanism complicating childbirth: Secondary | ICD-10-CM | POA: Diagnosis present

## 2019-12-02 DIAGNOSIS — O134 Gestational [pregnancy-induced] hypertension without significant proteinuria, complicating childbirth: Secondary | ICD-10-CM | POA: Diagnosis present

## 2019-12-02 DIAGNOSIS — Q0700 Arnold-Chiari syndrome without spina bifida or hydrocephalus: Secondary | ICD-10-CM

## 2019-12-02 DIAGNOSIS — O99892 Other specified diseases and conditions complicating childbirth: Secondary | ICD-10-CM | POA: Diagnosis present

## 2019-12-02 DIAGNOSIS — O9962 Diseases of the digestive system complicating childbirth: Secondary | ICD-10-CM | POA: Diagnosis present

## 2019-12-02 DIAGNOSIS — D6959 Other secondary thrombocytopenia: Secondary | ICD-10-CM | POA: Diagnosis present

## 2019-12-02 DIAGNOSIS — O3663X0 Maternal care for excessive fetal growth, third trimester, not applicable or unspecified: Secondary | ICD-10-CM | POA: Diagnosis present

## 2019-12-02 DIAGNOSIS — K219 Gastro-esophageal reflux disease without esophagitis: Secondary | ICD-10-CM | POA: Diagnosis present

## 2019-12-02 DIAGNOSIS — Z3A37 37 weeks gestation of pregnancy: Secondary | ICD-10-CM

## 2019-12-02 DIAGNOSIS — D696 Thrombocytopenia, unspecified: Secondary | ICD-10-CM | POA: Diagnosis present

## 2019-12-02 DIAGNOSIS — O133 Gestational [pregnancy-induced] hypertension without significant proteinuria, third trimester: Secondary | ICD-10-CM

## 2019-12-02 LAB — CBC
HCT: 36.6 % (ref 36.0–46.0)
Hemoglobin: 12.2 g/dL (ref 12.0–15.0)
MCH: 28.8 pg (ref 26.0–34.0)
MCHC: 33.3 g/dL (ref 30.0–36.0)
MCV: 86.5 fL (ref 80.0–100.0)
Platelets: 118 10*3/uL — ABNORMAL LOW (ref 150–400)
RBC: 4.23 MIL/uL (ref 3.87–5.11)
RDW: 14.3 % (ref 11.5–15.5)
WBC: 8.1 10*3/uL (ref 4.0–10.5)
nRBC: 0 % (ref 0.0–0.2)

## 2019-12-02 LAB — TYPE AND SCREEN
ABO/RH(D): O POS
Antibody Screen: NEGATIVE

## 2019-12-02 LAB — COMPREHENSIVE METABOLIC PANEL
ALT: 14 U/L (ref 0–44)
AST: 17 U/L (ref 15–41)
Albumin: 2.6 g/dL — ABNORMAL LOW (ref 3.5–5.0)
Alkaline Phosphatase: 92 U/L (ref 38–126)
Anion gap: 13 (ref 5–15)
BUN: <5 mg/dL — ABNORMAL LOW (ref 6–20)
CO2: 20 mmol/L — ABNORMAL LOW (ref 22–32)
Calcium: 9.2 mg/dL (ref 8.9–10.3)
Chloride: 108 mmol/L (ref 98–111)
Creatinine, Ser: 0.6 mg/dL (ref 0.44–1.00)
GFR calc Af Amer: >60 mL/min (ref >60)
GFR calc non Af Amer: >60 mL/min (ref >60)
Glucose, Bld: 104 mg/dL — ABNORMAL HIGH (ref 70–99)
Potassium: 4.1 mmol/L (ref 3.5–5.1)
Sodium: 141 mmol/L (ref 135–145)
Total Bilirubin: 0.4 mg/dL (ref 0.3–1.2)
Total Protein: 5.7 g/dL — ABNORMAL LOW (ref 6.5–8.1)

## 2019-12-02 LAB — PROTEIN / CREATININE RATIO, URINE
Creatinine, Urine: 49.19 mg/dL
Protein Creatinine Ratio: 0.22 mg/mg{Cre} — ABNORMAL HIGH (ref 0.00–0.15)
Total Protein, Urine: 11 mg/dL

## 2019-12-02 LAB — ABO/RH: ABO/RH(D): O POS

## 2019-12-02 MED ORDER — MISOPROSTOL 25 MCG QUARTER TABLET
25.0000 ug | ORAL_TABLET | ORAL | Status: DC | PRN
  Administered 2019-12-02 (×3): 25 ug via VAGINAL
  Filled 2019-12-02 (×3): qty 1

## 2019-12-02 MED ORDER — EPHEDRINE 5 MG/ML INJ: 10.0000 mg | INTRAVENOUS | Status: DC | PRN

## 2019-12-02 MED ORDER — ONDANSETRON HCL 4 MG/2ML IJ SOLN: 4.0000 mg | Freq: Four times a day (QID) | INTRAMUSCULAR | Status: DC | PRN

## 2019-12-02 MED ORDER — FENTANYL-BUPIVACAINE-NACL 0.5-0.125-0.9 MG/250ML-% EP SOLN
12.0000 mL/h | EPIDURAL | Status: DC | PRN
  Filled 2019-12-02: qty 250

## 2019-12-02 MED ORDER — LACTATED RINGERS IV SOLN: INTRAVENOUS | Status: DC

## 2019-12-02 MED ORDER — LACTATED RINGERS IV SOLN: 500.0000 mL | Freq: Once | INTRAVENOUS | Status: DC

## 2019-12-02 MED ORDER — LACTATED RINGERS IV SOLN: 500.0000 mL | INTRAVENOUS | Status: DC | PRN

## 2019-12-02 MED ORDER — PHENYLEPHRINE 40 MCG/ML (10ML) SYRINGE FOR IV PUSH (FOR BLOOD PRESSURE SUPPORT): 80.0000 ug | PREFILLED_SYRINGE | INTRAVENOUS | Status: DC | PRN

## 2019-12-02 MED ORDER — ACETAMINOPHEN 325 MG PO TABS
650.0000 mg | ORAL_TABLET | ORAL | Status: DC | PRN
  Administered 2019-12-03: 650 mg via ORAL
  Filled 2019-12-02: qty 2

## 2019-12-02 MED ORDER — LIDOCAINE HCL (PF) 1 % IJ SOLN: 30.0000 mL | INTRAMUSCULAR | Status: DC | PRN

## 2019-12-02 MED ORDER — OXYCODONE-ACETAMINOPHEN 5-325 MG PO TABS: 2.0000 | ORAL_TABLET | ORAL | Status: DC | PRN

## 2019-12-02 MED ORDER — DIPHENHYDRAMINE HCL 50 MG/ML IJ SOLN: 12.5000 mg | INTRAMUSCULAR | Status: DC | PRN

## 2019-12-02 MED ORDER — OXYTOCIN 40 UNITS IN NORMAL SALINE INFUSION - SIMPLE MED
2.5000 [IU]/h | INTRAVENOUS | Status: DC
  Administered 2019-12-03: 2.5 [IU]/h via INTRAVENOUS

## 2019-12-02 MED ORDER — OXYTOCIN BOLUS FROM INFUSION
500.0000 mL | Freq: Once | INTRAVENOUS | Status: AC
  Administered 2019-12-03: 500 mL via INTRAVENOUS

## 2019-12-02 MED ORDER — TERBUTALINE SULFATE 1 MG/ML IJ SOLN: 0.2500 mg | Freq: Once | INTRAMUSCULAR | Status: DC | PRN

## 2019-12-02 MED ORDER — SOD CITRATE-CITRIC ACID 500-334 MG/5ML PO SOLN
30.0000 mL | ORAL | Status: DC | PRN
  Administered 2019-12-03: 30 mL via ORAL
  Filled 2019-12-02: qty 30

## 2019-12-02 MED ORDER — OXYCODONE-ACETAMINOPHEN 5-325 MG PO TABS: 1.0000 | ORAL_TABLET | ORAL | Status: DC | PRN

## 2019-12-02 NOTE — Progress Notes (Addendum)
Cytotec vaginal at 3 pm.  UCs noted, not feeling them.  [place] to have light laboring diet, needs to ambulate since high station Anesthesia aware CBC- platelets 118  V.Benjie Karvonen, MD

## 2019-12-02 NOTE — Progress Notes (Signed)
IOL for GHTN. Has Chiari Malformation I. Has Gest thrombocytopenia.  Cytotec #2 25 mcg vag at 7.15 pm. No UCs/ pressure/ VB/ LOF  BP (!) 147/90   Pulse (!) 112   Temp 98 F (36.7 C) (Oral)   Resp 18  Toco irreg   FHT cat I Cx closed, soft/ -4/ Vx.  CBC- platelets 118  Ok to have light laboring diet, needs to ambulate since high station or sit up  Anesthesia aware, epidural planned. Working towards SVD but low threshold for C/s.Marland Kitchen Shorten pushing stage. Prior SVD 7'13" now 8 lbs.  V.Nicolette Gieske, MD

## 2019-12-02 NOTE — H&P (Signed)
Glenda Marsh is a 29 y.o. female G2P0101, at 37.2 wks, here for IOL after office visit today had BP of 140/86 and 160/90. She has h/o GHTN and is on bbASA. Also gestational thrombocytopenia and Chiari Malformation I, MRI  was initially done for hyperprolactinemia. sees [facility] Neurology Dr Krista Blue. Denies HA/ vision changes.   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  Patient denies any abnormal symptoms. Per articles, she can still try vaginal delivery under epidural, but the threshold for C-section should be lower when compared to normal populations.  Epidural anesthesia if necessary should be performed with caution to avoid dural punctuation.  PNCare from 7 wks, dating by 1st trim sono. Anatomy nl. Growth LGA. Office sono today for S>D, 8'1" and AC >90%, AFI 22 cm. No GDM bbASA fo h/o GHTN GERD- Pantoprazole Anxiety- no meds- did well with Zoloft, discuss starting back PP   Prior SVD at 36.2 wks after SROM, Boy 7'13" -episiotomy, bilateral sulcal tears    OB History    Gravida  2   Para  1   Term      Preterm  1   AB      Living  1     SAB      TAB      Ectopic      Multiple  0   Live Births  1          Past Medical History:  Diagnosis Date  . Anxiety   . Arnold-Chiari malformation ([place])   . Gestational thrombocytopenia ([place])   . Hyperprolactinemia ([place])   . Hypertension   . Menstrual migraine    Past Surgical History:  Procedure Laterality Date  . NO PAST SURGERIES     Family History: family history includes Bladder Cancer in her maternal grandfather; Diabetes in her maternal grandmother and paternal grandmother; Healthy in her father and mother; Heart attack in her paternal grandfather; Hypertension in her  maternal grandmother. Social History:  reports that she has never smoked. She has never used smokeless tobacco. She reports previous alcohol use. She reports that she does not use drugs.     Maternal Diabetes: No Genetic Screening: declined. Nl AFP1 Maternal Ultrasounds/Referrals: Normal Fetal Ultrasounds or other Referrals:  None Maternal Substance Abuse:  No Significant Maternal Medications:  BbASA, Pantoprazole Significant Maternal Lab Results:  Group B Strep negative Other Comments:  Mother has Chiari Malformation I, Gestational HTN, Gestations thrombocytopenia   Review of Systems no HA/ vision changes/ RUQ pain. Has bilateral LE swelling since weeks, wears compression stockings  History   unknown if currently breastfeeding. Exam Physical Exam  BP (!) 143/97   Pulse (!) 112   Temp 98.8 F (37.1 C) (Oral)   Resp 18  Physical exam:  A&O x 3, no acute distress. Pleasant HEENT neg, no thyromegaly Lungs CTA bilat CV RRR, S1S2 normal Abdo soft, non tender, non acute, gravid  Extr no edema/ tenderness Pelvic closed, long, high unengaged head, borderline pelvis  (cephalic by sono today) but was oblique lie last week  FHT  140s + accels no decels mod variability- cat I Toco  Rare   Prenatal labs: ABO, Rh:  O+ Antibody:  Neg Rubella:  Imm RPR:   NR HBsAg:  Neg  HIV:   Neg GBS:   Neg Glucola nl Low Platelets  AFP1 nl   Assessment/Plan: 29 yo G2P0101, 37.2 wks, IOL for Gestational HTN. Stable BPs, no severe range and no symptoms, defer magnesium, PIH labs, no PEC  Cytotec 1-2 doses I am concerned about high unengaged head with oblique lie visit but cephalic by sono today, EFW 8'1" today.  Plan controlled AROM in active labor. Shorten 2nd stage but dont recommend vacuum due to shoulder dystocia risk Epidural in labor and for C/s if needed, NO SPINAL, Dr Royce Macadamia consulted and informed and is in agreement- reviewed Epic noted and MRI report  Pt understands low threshold for  C-section her her Neuro   Elveria Royals 12/02/2019, 2:35 PM

## 2019-12-03 ENCOUNTER — Inpatient Hospital Stay (HOSPITAL_COMMUNITY): Payer: 59 | Admitting: Anesthesiology

## 2019-12-03 ENCOUNTER — Encounter (HOSPITAL_COMMUNITY): Payer: Self-pay | Admitting: Obstetrics & Gynecology

## 2019-12-03 LAB — CBC
HCT: 34.7 % — ABNORMAL LOW (ref 36.0–46.0)
HCT: 36.4 % (ref 36.0–46.0)
Hemoglobin: 11.5 g/dL — ABNORMAL LOW (ref 12.0–15.0)
Hemoglobin: 12.2 g/dL (ref 12.0–15.0)
MCH: 28.5 pg (ref 26.0–34.0)
MCH: 28.7 pg (ref 26.0–34.0)
MCHC: 33.1 g/dL (ref 30.0–36.0)
MCHC: 33.5 g/dL (ref 30.0–36.0)
MCV: 85.6 fL (ref 80.0–100.0)
MCV: 85.9 fL (ref 80.0–100.0)
Platelets: 112 10*3/uL — ABNORMAL LOW (ref 150–400)
Platelets: 121 10*3/uL — ABNORMAL LOW (ref 150–400)
RBC: 4.04 MIL/uL (ref 3.87–5.11)
RBC: 4.25 MIL/uL (ref 3.87–5.11)
RDW: 14.3 % (ref 11.5–15.5)
RDW: 14.5 % (ref 11.5–15.5)
WBC: 18 10*3/uL — ABNORMAL HIGH (ref 4.0–10.5)
WBC: 8.9 10*3/uL (ref 4.0–10.5)
nRBC: 0 % (ref 0.0–0.2)
nRBC: 0 % (ref 0.0–0.2)

## 2019-12-03 LAB — RPR: RPR Ser Ql: NONREACTIVE

## 2019-12-03 LAB — SARS CORONAVIRUS 2 (TAT 6-24 HRS): SARS Coronavirus 2: NEGATIVE

## 2019-12-03 MED ORDER — ONDANSETRON HCL 4 MG PO TABS: 4.0000 mg | ORAL_TABLET | ORAL | Status: DC | PRN

## 2019-12-03 MED ORDER — LIDOCAINE HCL (PF) 1 % IJ SOLN
INTRAMUSCULAR | Status: DC | PRN
  Administered 2019-12-03: 10 mL via EPIDURAL
  Administered 2019-12-03: 2 mL via EPIDURAL

## 2019-12-03 MED ORDER — PRENATAL MULTIVITAMIN CH
1.0000 | ORAL_TABLET | Freq: Every day | ORAL | Status: DC
  Administered 2019-12-04 – 2019-12-05 (×2): 1 via ORAL
  Filled 2019-12-03 (×2): qty 1

## 2019-12-03 MED ORDER — SENNOSIDES-DOCUSATE SODIUM 8.6-50 MG PO TABS
2.0000 | ORAL_TABLET | ORAL | Status: DC
  Administered 2019-12-04 – 2019-12-05 (×2): 2 via ORAL
  Filled 2019-12-03 (×2): qty 2

## 2019-12-03 MED ORDER — BENZOCAINE-MENTHOL 20-0.5 % EX AERO: 1.0000 "application " | INHALATION_SPRAY | CUTANEOUS | Status: DC | PRN

## 2019-12-03 MED ORDER — IBUPROFEN 600 MG PO TABS
600.0000 mg | ORAL_TABLET | Freq: Four times a day (QID) | ORAL | Status: DC
  Administered 2019-12-04 – 2019-12-05 (×7): 600 mg via ORAL
  Filled 2019-12-03 (×7): qty 1

## 2019-12-03 MED ORDER — WITCH HAZEL-GLYCERIN EX PADS: 1.0000 "application " | MEDICATED_PAD | CUTANEOUS | Status: DC | PRN

## 2019-12-03 MED ORDER — ONDANSETRON HCL 4 MG/2ML IJ SOLN: 4.0000 mg | INTRAMUSCULAR | Status: DC | PRN

## 2019-12-03 MED ORDER — SERTRALINE HCL 50 MG PO TABS
50.0000 mg | ORAL_TABLET | Freq: Every day | ORAL | Status: DC
  Administered 2019-12-04 – 2019-12-05 (×2): 50 mg via ORAL
  Filled 2019-12-03 (×2): qty 1

## 2019-12-03 MED ORDER — ACETAMINOPHEN 325 MG PO TABS
650.0000 mg | ORAL_TABLET | ORAL | Status: DC | PRN
  Administered 2019-12-04 – 2019-12-05 (×4): 650 mg via ORAL
  Filled 2019-12-03 (×4): qty 2

## 2019-12-03 MED ORDER — CABERGOLINE 0.5 MG PO TABS: 0.2500 mg | ORAL_TABLET | ORAL | Status: DC

## 2019-12-03 MED ORDER — OXYTOCIN 40 UNITS IN NORMAL SALINE INFUSION - SIMPLE MED
1.0000 m[IU]/min | INTRAVENOUS | Status: DC
  Administered 2019-12-03: 14 m[IU]/min via INTRAVENOUS
  Administered 2019-12-03: 1 m[IU]/min via INTRAVENOUS
  Filled 2019-12-03: qty 1000

## 2019-12-03 MED ORDER — TETANUS-DIPHTH-ACELL PERTUSSIS 5-2.5-18.5 LF-MCG/0.5 IM SUSP: 0.5000 mL | Freq: Once | INTRAMUSCULAR | Status: DC

## 2019-12-03 MED ORDER — SODIUM CHLORIDE (PF) 0.9 % IJ SOLN
INTRAMUSCULAR | Status: DC | PRN
  Administered 2019-12-03: 12 mL/h via EPIDURAL

## 2019-12-03 MED ORDER — TERBUTALINE SULFATE 1 MG/ML IJ SOLN: 0.2500 mg | Freq: Once | INTRAMUSCULAR | Status: DC | PRN

## 2019-12-03 MED ORDER — COCONUT OIL OIL: 1.0000 "application " | TOPICAL_OIL | Status: DC | PRN

## 2019-12-03 MED ORDER — DIPHENHYDRAMINE HCL 25 MG PO CAPS: 25.0000 mg | ORAL_CAPSULE | Freq: Four times a day (QID) | ORAL | Status: DC | PRN

## 2019-12-03 MED ORDER — SIMETHICONE 80 MG PO CHEW: 80.0000 mg | CHEWABLE_TABLET | ORAL | Status: DC | PRN

## 2019-12-03 MED ORDER — DIBUCAINE (PERIANAL) 1 % EX OINT: 1.0000 "application " | TOPICAL_OINTMENT | CUTANEOUS | Status: DC | PRN

## 2019-12-03 MED ORDER — NORTRIPTYLINE HCL 10 MG PO CAPS
10.0000 mg | ORAL_CAPSULE | Freq: Every day | ORAL | Status: DC
  Filled 2019-12-03 (×3): qty 1

## 2019-12-03 NOTE — Progress Notes (Signed)
Comfortable with epidural  Patient Vitals for the past 24 hrs:  BP Temp Temp src Pulse Resp Height Weight  12/03/19 1800 (!) 139/92 98.2 F (36.8 C) Oral 84 18 -- --  12/03/19 1730 134/85 -- -- 96 16 -- --  12/03/19 1700 133/86 -- -- 94 20 -- --  12/03/19 1630 130/76 -- -- (!) 102 16 -- --  12/03/19 1600 123/74 -- -- 99 -- -- --  12/03/19 1530 140/89 97.9 F (36.6 C) Oral 92 16 -- --  12/03/19 1501 137/86 -- -- 89 -- -- --  12/03/19 1434 (!) 154/79 -- -- 97 16 -- --  12/03/19 1400 -- 99.1 F (37.3 C) Oral -- -- -- --  12/03/19 1330 118/60 -- -- 97 -- -- --  12/03/19 1300 115/60 -- -- 94 18 -- --  12/03/19 1230 115/62 -- -- 94 16 -- --  12/03/19 1200 140/88 -- -- (!) 105 20 -- --  12/03/19 1130 (!) 141/84 98.3 F (36.8 C) Oral (!) 103 16 -- --  12/03/19 1100 117/66 -- -- 92 20 -- --  12/03/19 1030 119/65 -- -- 96 16 -- --  12/03/19 1000 118/66 -- -- 90 20 -- --  12/03/19 0930 (!) 120/58 98.5 F (36.9 C) Oral 91 16 -- --  12/03/19 0900 126/78 -- -- 91 16 -- --  12/03/19 0830 131/84 -- -- 85 16 -- --  12/03/19 0800 (!) 138/91 98.5 F (36.9 C) Axillary 93 18 -- --  12/03/19 0731 (!) 141/86 -- -- (!) 102 16 -- --  12/03/19 0700 (!) 143/86 -- -- (!) 107 -- -- --  12/03/19 0640 (!) 141/73 -- -- 96 -- -- --  12/03/19 0635 136/82 -- -- 94 -- -- --  12/03/19 0630 134/84 -- -- 99 -- -- --  12/03/19 0625 134/84 -- -- (!) 101 -- -- --  12/03/19 0620 136/81 -- -- (!) 102 17 -- --  12/03/19 0615 (!) 153/70 -- -- 100 16 -- --  12/03/19 0613 (!) 142/89 -- -- 94 16 -- --  12/03/19 0600 (!) 146/91 98.4 F (36.9 C) Oral 99 15 -- --  12/03/19 0548 -- -- -- -- -- 5\' 6"  (1.676 m) 90.6 kg  12/03/19 0533 135/77 -- -- 98 17 -- --  12/03/19 0500 (!) 151/101 -- -- 99 18 -- --  12/03/19 0430 (!) 141/90 -- -- 97 17 -- --  12/03/19 0400 (!) 131/91 -- -- 94 -- -- --  12/03/19 0350 (!) 135/94 98.2 F (36.8 C) Oral 95 -- -- --  12/03/19 0256 135/70 -- -- 87 16 -- --  12/03/19 0133 (!) 147/89 -- -- 93  15 -- --  12/02/19 2332 (!) 151/96 -- -- 97 15 -- --  12/02/19 2259 (!) 153/97 -- -- (!) 102 16 -- --  12/02/19 2143 (!) 148/90 98.1 F (36.7 C) -- 97 16 -- --  12/02/19 1930 (!) 147/90 98 F (36.7 C) Oral (!) 112 -- -- --   A&ox3 nml respirations Abd: soft, nt, gravid CX: 5/90/-2, caput, cx with edema  Fht: 140s, nml variability, +accels, no decels TOCO: irregular, q 1-41min  A/P: iup at 37.2 1. IOL - slow cervical change, now [id number], cephalic; contin pitocin, ctx adequate; plan to recheck in about 1 1/2 hrs; pt has had prolonged IOL and now slow progress but still with cervical change, pt may be transitioning into active labor, but also concerned for edematous cervix; guarded MOD; pt understands that if  not making good progress will plan for c/s d/t her h/o arnold chiari; also concerned for fetal size and CPD; follow closely; contin position change with peanut 2. Fht: reassuring

## 2019-12-03 NOTE — Progress Notes (Signed)
IOL for GHTN.  Chiari Malformation I, Gest thrombocytopenia.  S/p Cytotec x 3 doses, pitocin since 3.30 AM. S/p epidural. Feels well.    BP (!) 141/86   Pulse (!) 102   Temp 98.4 F (36.9 C) (Oral)   Resp 17   Ht 5\' 6"  (1.676 m)   Wt 90.6 kg   BMI 32.25 kg/m   Patient Vitals for the past 24 hrs:  BP Temp Temp src Pulse Resp Height Weight  12/03/19 0731 (!) 141/86 -- -- (!) 102 16 -- --  12/03/19 0700 (!) 143/86 -- -- (!) 107 -- -- --  12/03/19 0640 (!) 141/73 -- -- 96 -- -- --  12/03/19 0635 136/82 -- -- 94 -- -- --  12/03/19 0630 134/84 -- -- 99 -- -- --  12/03/19 0625 134/84 -- -- (!) 101 -- -- --  12/03/19 0620 136/81 -- -- (!) 102 17 -- --  12/03/19 0615 (!) 153/70 -- -- 100 16 -- --  12/03/19 0613 (!) 142/89 -- -- 94 16 -- --  12/03/19 0600 (!) 146/91 98.4 F (36.9 C) Oral 99 15 -- --  12/03/19 0548 -- -- -- -- -- 5\' 6"  (1.676 m) 90.6 kg  12/03/19 0533 135/77 -- -- 98 17 -- --  12/03/19 0500 (!) 151/101 -- -- 99 18 -- --  12/03/19 0430 (!) 141/90 -- -- 97 17 -- --  12/03/19 0400 (!) 131/91 -- -- 94 -- -- --  12/03/19 0350 (!) 135/94 98.2 F (36.8 C) Oral 95 -- -- --  12/03/19 0256 135/70 -- -- 87 16 -- --  12/03/19 0133 (!) 147/89 -- -- 93 15 -- --  12/02/19 2332 (!) 151/96 -- -- 97 15 -- --  12/02/19 2259 (!) 153/97 -- -- (!) 102 16 -- --  12/02/19 2143 (!) 148/90 98.1 F (36.7 C) -- 97 16 -- --  12/02/19 1930 (!) 147/90 98 F (36.7 C) Oral (!) 112 -- -- --  12/02/19 1558 (!) 149/85 -- -- (!) 104 18 -- --  12/02/19 1445 (!) 143/97 98.8 F (37.1 C) Oral (!) 112 18 -- --   UCs- regular q 2-3 min, pitocin FHT cat I Cx 1 / soft/ 40%/ above pelvic inlet/ unengaged Vx.  Controlled AROM--> copious clear fluid, head tends to stay oblique but will attempt to correct with position- try chair position  A/P: Early labor, now pitocin.S/p epidural. GBS(-). Assess descent.  Working towards SVD but low threshold for C/s.Marland Kitchen Shorten pushing stage. Prior SVD 7'13" now 8  lbs.  V.Ebrima Ranta, MD

## 2019-12-03 NOTE — Progress Notes (Signed)
IOL for GHTN. Has Chiari Malformation I. Has Gest thrombocytopenia.   Cytotec #3 placed by RN at 11.15 pm since cx closed, thick, high station  BP (!) 151/96   Pulse 97   Temp 98.1 F (36.7 C)   Resp 15  Toco irreg   FHT cat I Cx closed, soft/ -4/ Vx.  CBC- platelets 118  IOL GHTN, Chiari Malformation I, low platelets in preg. Anesthesia aware, epidural planned. Working towards SVD but low threshold for C/s.Marland Kitchen Shorten pushing stage. Prior SVD 7'13" now 8 lbs.  V.Mody, MD

## 2019-12-03 NOTE — Anesthesia Procedure Notes (Signed)
Epidural Patient location during procedure: OB Start time: 12/03/2019 6:06 AM End time: 12/03/2019 6:16 AM  Staffing Anesthesiologist: Pervis Hocking, DO Performed: anesthesiologist   Preanesthetic Checklist Completed: patient identified, IV checked, risks and benefits discussed, monitors and equipment checked, pre-op evaluation and timeout performed  Epidural Patient position: sitting Prep: DuraPrep and site prepped and draped Patient monitoring: continuous pulse ox, blood pressure, heart rate and cardiac monitor Approach: midline Location: L3-L4 Injection technique: LOR air  Needle:  Needle type: Tuohy  Needle gauge: 17 G Needle length: 9 cm Needle insertion depth: 5 cm Catheter type: closed end flexible Catheter size: 19 Gauge Catheter at skin depth: 10 cm Test dose: negative  Assessment Sensory level: T8 Events: blood not aspirated, injection not painful, no injection resistance, no paresthesia and negative IV test  Additional Notes Patient identified. Risks/Benefits/Options discussed with patient including but not limited to bleeding, infection, nerve damage, paralysis, failed block, incomplete pain control, headache, blood pressure changes, nausea, vomiting, reactions to medication both or allergic, itching and postpartum back pain. Confirmed with bedside nurse the patient's most recent platelet count. Confirmed with patient that they are not currently taking any anticoagulation, have any bleeding history or any family history of bleeding disorders. Patient expressed understanding and wished to proceed. All questions were answered. Sterile technique was used throughout the entire procedure. Please see nursing notes for vital signs. Test dose was given through epidural catheter and negative prior to continuing to dose epidural or start infusion. Warning signs of high block given to the patient including shortness of breath, tingling/numbness in hands, complete motor  block, or any concerning symptoms with instructions to call for help. Patient was given instructions on fall risk and not to get out of bed. All questions and concerns addressed with instructions to call with any issues or inadequate analgesia.  Reason for block:procedure for pain

## 2019-12-03 NOTE — Anesthesia Preprocedure Evaluation (Signed)
Anesthesia Evaluation  Patient identified by MRN, date of birth, ID band Patient awake    Reviewed: Allergy & Precautions, Patient's Chart, lab work & pertinent test results  Airway Mallampati: II  TM Distance: >3 FB Neck ROM: Full    Dental no notable dental hx.    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, no syrinxes or cord compression identified     GI/Hepatic negative GI ROS, Neg liver ROS,   Endo/Other  Hyperprolactinemia   Renal/GU negative Renal ROS  negative genitourinary   Musculoskeletal   Abdominal   Peds  Hematology  (+) Blood dyscrasia, anemia , Gestational thrombocytopenia , plt 112 hct 34.7   Anesthesia Other Findings   Reproductive/Obstetrics (+) Pregnancy                             Anesthesia Physical Anesthesia Plan  ASA: III and emergent  Anesthesia Plan: Epidural   Post-op Pain Management:    Induction:   PONV Risk Score and Plan: 2  Airway Management Planned: Natural Airway  Additional Equipment: None  Intra-op Plan:   Post-operative Plan:   Informed Consent: I have reviewed the patients History and Physical, chart, labs and discussed the procedure including the risks, benefits and alternatives for the proposed anesthesia with the patient or authorized representative who has indicated his/her understanding and acceptance.       Plan Discussed with:   Anesthesia Plan Comments:         Anesthesia Quick Evaluation

## 2019-12-03 NOTE — Progress Notes (Signed)
No c/o, comfortable with epidural  Patient Vitals for the past 24 hrs:  BP Temp Temp src Pulse Resp Height Weight  12/03/19 1400 -- 99.1 F (37.3 C) Oral -- -- -- --  12/03/19 1330 118/60 -- -- 97 -- -- --  12/03/19 1300 115/60 -- -- 94 18 -- --  12/03/19 1230 115/62 -- -- 94 16 -- --  12/03/19 1200 140/88 -- -- (!) 105 20 -- --  12/03/19 1130 (!) 141/84 98.3 F (36.8 C) Oral (!) 103 16 -- --  12/03/19 1100 117/66 -- -- 92 20 -- --  12/03/19 1030 119/65 -- -- 96 16 -- --  12/03/19 1000 118/66 -- -- 90 20 -- --  12/03/19 0930 (!) 120/58 98.5 F (36.9 C) Oral 91 16 -- --  12/03/19 0900 126/78 -- -- 91 16 -- --  12/03/19 0830 131/84 -- -- 85 16 -- --  12/03/19 0800 (!) 138/91 98.5 F (36.9 C) Axillary 93 18 -- --  12/03/19 0731 (!) 141/86 -- -- (!) 102 16 -- --  12/03/19 0700 (!) 143/86 -- -- (!) 107 -- -- --  12/03/19 0640 (!) 141/73 -- -- 96 -- -- --  12/03/19 0635 136/82 -- -- 94 -- -- --  12/03/19 0630 134/84 -- -- 99 -- -- --  12/03/19 0625 134/84 -- -- (!) 101 -- -- --  12/03/19 0620 136/81 -- -- (!) 102 17 -- --  12/03/19 0615 (!) 153/70 -- -- 100 16 -- --  12/03/19 0613 (!) 142/89 -- -- 94 16 -- --  12/03/19 0600 (!) 146/91 98.4 F (36.9 C) Oral 99 15 -- --  12/03/19 0548 -- -- -- -- -- 5\' 6"  (1.676 m) 90.6 kg  12/03/19 0533 135/77 -- -- 98 17 -- --  12/03/19 0500 (!) 151/101 -- -- 99 18 -- --  12/03/19 0430 (!) 141/90 -- -- 97 17 -- --  12/03/19 0400 (!) 131/91 -- -- 94 -- -- --  12/03/19 0350 (!) 135/94 98.2 F (36.8 C) Oral 95 -- -- --  12/03/19 0256 135/70 -- -- 87 16 -- --  12/03/19 0133 (!) 147/89 -- -- 93 15 -- --  12/02/19 2332 (!) 151/96 -- -- 97 15 -- --  12/02/19 2259 (!) 153/97 -- -- (!) 102 16 -- --  12/02/19 2143 (!) 148/90 98.1 F (36.7 C) -- 97 16 -- --  12/02/19 1930 (!) 147/90 98 F (36.7 C) Oral (!) 112 -- -- --  12/02/19 1558 (!) 149/85 -- -- (!) 104 18 -- --  12/02/19 1445 (!) 143/97 98.8 F (37.1 C) Oral (!) 112 18 -- --   A&ox3 nml  respirations Abd: soft, nt, gravid Cx: 3/60/-3; caput noted; iupc placed  Fht: 130s, nml variability, +accels, no decels Toco: irregular, 1-3 min; frequent couplet  A/P: iup at 37.3wga 1. IOL - s/p cytotec x3, contin pitocin (31mU); no cervical change in 2 hrs, iupc placed to evaluate for adequate contractions; patient has had a prolonged IOL and slow cervical change, high fetal station, now with no further change; will re-evaluate in 1-2 hrs and will plan c/s if no change; have reviewed this with patient and husband 2. Arnold chiari malformation - asymptomatic; per neuro, to minimize pushing with svd, low threshold for c/s 3. GHTN - bps wnl sine epidural, mild range prior; contin to monitor 4.  Fetal status reassuring 5. gbs neg 6. GHTN - gestational thrombocytopenia

## 2019-12-04 LAB — CBC
HCT: 34.9 % — ABNORMAL LOW (ref 36.0–46.0)
Hemoglobin: 11.5 g/dL — ABNORMAL LOW (ref 12.0–15.0)
MCH: 28.5 pg (ref 26.0–34.0)
MCHC: 33 g/dL (ref 30.0–36.0)
MCV: 86.4 fL (ref 80.0–100.0)
Platelets: 119 10*3/uL — ABNORMAL LOW (ref 150–400)
RBC: 4.04 MIL/uL (ref 3.87–5.11)
RDW: 14.3 % (ref 11.5–15.5)
WBC: 14.9 10*3/uL — ABNORMAL HIGH (ref 4.0–10.5)
nRBC: 0 % (ref 0.0–0.2)

## 2019-12-04 NOTE — Lactation Note (Signed)
This note was copied from a baby's chart. Lactation Consultation Note  Patient Name: Glenda Marsh [id number] Date: 12/04/2019 Reason for consult: Initial assessment;Early term 37-38.6wks;1st time breastfeeding P2.  Mom did not breastfeed her first baby.  Newborn is 37.3 weeks.  Baby is 60 hours old and has breastfed twice.  Discussed the first 24 hours and early term behaviors.  Baby is currently attempting to latch in cross cradle hold.  Mom states she prefers football hold so position changed.  Hand expression done but no colostrum seen.  Nipples are erect but short shafted.  Baby sleepy at breast.  He latched with shallow latch off and on both breasts.  He was unable to sustain latch and very sleepy.  Baby placed skin to skin on mom's chest.  She will watch for feeding cues.  Encouraged to call for assist prn.  Breastfeeding consultation services information given and reviewed.  Report given to RN.  Maternal Data Has patient been taught Hand Expression?: Yes Does the patient have breastfeeding experience prior to this delivery?: No  Feeding Feeding Type: Breast Fed  LATCH Score Latch: Repeated attempts needed to sustain latch, nipple held in mouth throughout feeding, stimulation needed to elicit sucking reflex.  Audible Swallowing: None  Type of Nipple: Everted at rest and after stimulation(short shafted)  Comfort (Breast/Nipple): Soft / non-tender  Hold (Positioning): Assistance needed to correctly position infant at breast and maintain latch.  LATCH Score: 6  Interventions Interventions: Breast feeding basics reviewed;Breast compression;Assisted with latch;Adjust position;Skin to skin;Support pillows;Breast massage;Position options;Hand express  Lactation Tools Discussed/Used     Consult Status Consult Status: Follow-up Date: 12/05/19 Follow-up type: In-patient    Ave Filter 12/04/2019, 11:23 AM

## 2019-12-04 NOTE — Anesthesia Postprocedure Evaluation (Signed)
Anesthesia Post Note  Patient: Glenda Marsh  Procedure(s) Performed: AN AD HOC LABOR EPIDURAL     Patient location during evaluation: Mother Baby Anesthesia Type: Epidural Level of consciousness: awake and alert Pain management: pain level controlled Vital Signs Assessment: post-procedure vital signs reviewed and stable Respiratory status: spontaneous breathing Cardiovascular status: stable Postop Assessment: no headache, no backache, epidural receding, no apparent nausea or vomiting, patient able to bend at knees, able to ambulate and adequate PO intake Anesthetic complications: no    Last Vitals:  Vitals:   12/04/19 0040 12/04/19 0300  BP: 138/87 137/87  Pulse: (!) 108 94  Resp: 16 16  Temp: 36.8 C 36.7 C  SpO2:      Last Pain:  Vitals:   12/04/19 0533  TempSrc:   PainSc: 0-No pain   Pain Goal:                   [employer]

## 2019-12-04 NOTE — Progress Notes (Signed)
[name] was referred for history of depression/anxiety. * Referral screened out by Clinical Social Worker because none of the following criteria appear to apply: ~ History of anxiety/depression during this pregnancy, or of post-partum depression following prior delivery. ~ Diagnosis of anxiety and/or depression within last 3 years OR * [name]'s symptoms currently being treated with medication and/or therapy. Per further chart review, it appears that [name] has active prescription for Zoloft for anxiety.    Please contact the Clinical Social Worker if needs arise, by [facility] request, or if [name] scores greater than 9/yes to question 10 on Edinburgh Postpartum Depression Screen.     Glenda Marsh, MSW, LCSW [facility] [place] [phone]

## 2019-12-04 NOTE — Progress Notes (Signed)
No c/o; normal lochia, no prob voiding; tol po Breastfeeding; pain controlled  Temp:  [97.7 F (36.5 C)-99.1 F (37.3 C)] 98 F (36.7 C) (05/21 0300) Pulse Rate:  [84-167] 94 (05/21 0300) Resp:  [16-20] 16 (05/21 0300) BP: (115-223)/(60-203) 137/87 (05/21 0300) SpO2:  [97 %-100 %] 100 % (05/20 2110)  A&ox3 rrr ctab Abd: soft, nt, nd; fundus firm and below umb LE: tr edema, nt bilat   CBC Latest Ref Rng & Units 12/04/2019 12/03/2019 12/02/2019  WBC 4.0 - 10.5 K/uL 14.9(H) 18.0(H) 8.9  Hemoglobin 12.0 - 15.0 g/dL 11.5(L) 12.2 11.5(L)  Hematocrit 36.0 - 46.0 % 34.9(L) 36.4 34.7(L)  Platelets 150 - 400 K/uL 119(L) 121(L) 112(L)   A/P: ppd 1 s/p svd 1. Doing well, contin care 2. Arnold chiari malformation - no sx, did well with epidural and minimal pushing 3. Anemia - mild and acute d/t blood loss; plan iron rich foods pp

## 2019-12-05 LAB — COMPREHENSIVE METABOLIC PANEL
ALT: 17 U/L (ref 0–44)
AST: 25 U/L (ref 15–41)
Albumin: 2.1 g/dL — ABNORMAL LOW (ref 3.5–5.0)
Alkaline Phosphatase: 66 U/L (ref 38–126)
Anion gap: 11 (ref 5–15)
BUN: 12 mg/dL (ref 6–20)
CO2: 22 mmol/L (ref 22–32)
Calcium: 8.5 mg/dL — ABNORMAL LOW (ref 8.9–10.3)
Chloride: 109 mmol/L (ref 98–111)
Creatinine, Ser: 0.74 mg/dL (ref 0.44–1.00)
GFR calc Af Amer: >60 mL/min (ref >60)
GFR calc non Af Amer: >60 mL/min (ref >60)
Glucose, Bld: 87 mg/dL (ref 70–99)
Potassium: 4.1 mmol/L (ref 3.5–5.1)
Sodium: 142 mmol/L (ref 135–145)
Total Bilirubin: 0.6 mg/dL (ref 0.3–1.2)
Total Protein: 4.5 g/dL — ABNORMAL LOW (ref 6.5–8.1)

## 2019-12-05 LAB — PROTEIN / CREATININE RATIO, URINE
Creatinine, Urine: 107.36 mg/dL
Protein Creatinine Ratio: 0.13 mg/mg{Cre} (ref 0.00–0.15)
Total Protein, Urine: 14 mg/dL

## 2019-12-05 LAB — CBC
HCT: 31.6 % — ABNORMAL LOW (ref 36.0–46.0)
Hemoglobin: 10.7 g/dL — ABNORMAL LOW (ref 12.0–15.0)
MCH: 29.5 pg (ref 26.0–34.0)
MCHC: 33.9 g/dL (ref 30.0–36.0)
MCV: 87.1 fL (ref 80.0–100.0)
Platelets: 110 10*3/uL — ABNORMAL LOW (ref 150–400)
RBC: 3.63 MIL/uL — ABNORMAL LOW (ref 3.87–5.11)
RDW: 14.4 % (ref 11.5–15.5)
WBC: 8.5 10*3/uL (ref 4.0–10.5)
nRBC: 0 % (ref 0.0–0.2)

## 2019-12-05 MED ORDER — LABETALOL HCL 100 MG PO TABS: 100.0000 mg | ORAL_TABLET | Freq: Two times a day (BID) | ORAL | 6 refills | Status: DC

## 2019-12-05 MED ORDER — IBUPROFEN 600 MG PO TABS: 600.0000 mg | ORAL_TABLET | Freq: Four times a day (QID) | ORAL | 6 refills | Status: DC | PRN

## 2019-12-05 MED ORDER — LABETALOL HCL 100 MG PO TABS
100.0000 mg | ORAL_TABLET | Freq: Two times a day (BID) | ORAL | Status: DC
  Administered 2019-12-05: 100 mg via ORAL
  Filled 2019-12-05: qty 1

## 2019-12-05 MED ORDER — HYDROCHLOROTHIAZIDE 12.5 MG PO CAPS: 12.5000 mg | ORAL_CAPSULE | Freq: Every day | ORAL | 0 refills | Status: DC

## 2019-12-05 NOTE — Progress Notes (Signed)
Pt reports that tingling feeling of her scalp and shaky feeling is gone. She reports feeling well.

## 2019-12-05 NOTE — Progress Notes (Signed)
PPD2 SVD:   S:  Pt reports feeling well. Notes leg swelling. Denies h/a, visual changes or epigastric pain. Hx elev BP in the past/ Tolerating po/ Voiding without problems/ No n/v/ Bleeding is light/ Pain controlled withprescription NSAID's including ibuprofen (Motrin)  Newborn info    O:  A & O x 3 / VS: Blood pressure (!) 149/91, pulse 78, temperature 97.9 F (36.6 C), temperature source Oral, resp. rate 16, height 5\' 6"  (1.676 m), weight 90.6 kg, SpO2 100 %, unknown if currently breastfeeding.  LABS:  Results for orders placed or performed during the hospital encounter of 12/02/19 (from the past 24 hour(s))  CBC     Status: Abnormal   Collection Time: 12/05/19 10:43 AM  Result Value Ref Range   WBC 8.5 4.0 - 10.5 K/uL   RBC 3.63 (L) 3.87 - 5.11 MIL/uL   Hemoglobin 10.7 (L) 12.0 - 15.0 g/dL   HCT 31.6 (L) 36.0 - 46.0 %   MCV 87.1 80.0 - 100.0 fL   MCH 29.5 26.0 - 34.0 pg   MCHC 33.9 30.0 - 36.0 g/dL   RDW 14.4 11.5 - 15.5 %   Platelets 110 (L) 150 - 400 K/uL   nRBC 0.0 0.0 - 0.2 %  Comprehensive metabolic panel     Status: Abnormal   Collection Time: 12/05/19 10:43 AM  Result Value Ref Range   Sodium 142 135 - 145 mmol/L   Potassium 4.1 3.5 - 5.1 mmol/L   Chloride 109 98 - 111 mmol/L   CO2 22 22 - 32 mmol/L   Glucose, Bld 87 70 - 99 mg/dL   BUN 12 6 - 20 mg/dL   Creatinine, Ser 0.74 0.44 - 1.00 mg/dL   Calcium 8.5 (L) 8.9 - 10.3 mg/dL   Total Protein 4.5 (L) 6.5 - 8.1 g/dL   Albumin 2.1 (L) 3.5 - 5.0 g/dL   AST 25 15 - 41 U/L   ALT 17 0 - 44 U/L   Alkaline Phosphatase 66 38 - 126 U/L   Total Bilirubin 0.6 0.3 - 1.2 mg/dL   GFR calc non Af Amer >60 >60 mL/min   GFR calc Af Amer >60 >60 mL/min   Anion gap 11 5 - 15  Protein / creatinine ratio, urine     Status: None   Collection Time: 12/05/19 10:50 AM  Result Value Ref Range   Creatinine, Urine 107.36 mg/dL   Total Protein, Urine 14 mg/dL   Protein Creatinine Ratio 0.13 0.00 - 0.15 mg/mg[Cre]    I&O: I/O last 3  completed shifts: In: 0  Out: 1040 [Urine:650; Blood:390]   No intake/output data recorded.  Lungs: chest clear, no wheezing, rales, normal symmetric air entry  Heart: regular rate and rhythm, S1, S2 normal, no murmur, click, rub or gallop  Abdomen: soft obese uterus firm   Perineum: nml  Lochia: light  Extremities:edema 2+   CBC Latest Ref Rng & Units 12/05/2019 12/04/2019 12/03/2019  WBC 4.0 - 10.5 K/uL 8.5 14.9(H) 18.0(H)  Hemoglobin 12.0 - 15.0 g/dL 10.7(L) 11.5(L) 12.2  Hematocrit 36.0 - 46.0 % 31.6(L) 34.9(L) 36.4  Platelets 150 - 400 K/uL 110(L) 119(L) 121(L)   CMP Latest Ref Rng & Units 12/05/2019 12/02/2019 10/12/2016  Glucose 70 - 99 mg/dL 87 104(H) 108(H)  BUN 6 - 20 mg/dL 12 <5(L) 10  Creatinine 0.44 - 1.00 mg/dL 0.74 0.60 0.67  Sodium 135 - 145 mmol/L 142 141 137  Potassium 3.5 - 5.1 mmol/L 4.1 4.1 3.9  Chloride  98 - 111 mmol/L 109 108 107  CO2 22 - 32 mmol/L 22 20(L) 23  Calcium 8.9 - 10.3 mg/dL 8.5(L) 9.2 8.0(L)  Total Protein 6.5 - 8.1 g/dL 4.5(L) 5.7(L) 4.9(L)  Total Bilirubin 0.3 - 1.2 mg/dL 0.6 0.4 0.4  Alkaline Phos 38 - 126 U/L 66 92 69  AST 15 - 41 U/L 25 17 67(H)  ALT 0 - 44 U/L 17 14 39    A/P: PPD # 2/ [id number] Gest HTN ? Chronic HTN w/o preeclampsia  Doing well  Continue routine post partum orders  Will start labetalol. Low dose HCTZ x 5 days F/u BP in office coming week. Pt has BP cuff at home and can check( usually does) After reviewing BP measures from admission to now, pt may be discharged home. Pt agrees with plan [facility] pp booklet given D/c instructions reviewed

## 2019-12-05 NOTE — Progress Notes (Signed)
Baby received a D/C order this afternoon. Mom and baby DC'd. Reviewed signs and symptoms of pre eclampsia and hand out given. Advised pt not to take labetalol per Dr. Garwin Brothers and to take HCTZ as prescribed and to also schedule BP check on Monday with MD office.

## 2019-12-05 NOTE — Discharge Summary (Signed)
Postpartum Discharge Summary  Date of Service updated     Patient Name: Glenda Marsh DOB: 1991-02-16 MRN: [id number]  Date of admission: 12/02/2019 Delivery date:12/03/2019  Delivering provider: Tiana Loft E  Date of discharge: 12/05/2019  Admitting diagnosis: Gestational HTN [O13.9] Intrauterine pregnancy: [redacted]w[redacted]d    Secondary diagnosis:  Principal Problem:   Gestational HTN Active Problems:   Thrombocytopenia ([facility]   Arnold-Chiari deformity ([facility]  Additional problems: GERD    Discharge diagnosis: Term Pregnancy Delivered and Gestational Hypertension                                              Post partum procedures:none Augmentation: AROM and Pitocin Complications: None  Hospital course: Induction of Labor With Vaginal Delivery   29y.o. yo G2P1102 at 325w3das admitted to the hospital 12/02/2019 for induction of labor.  Indication for induction: Gestational hypertension.  Patient had an uncomplicated labor course as follows: Membrane Rupture Time/Date: 7:20 AM ,12/03/2019   Delivery Method:Vaginal, Spontaneous  Episiotomy: None  Lacerations:  2nd degree;Vaginal  Details of delivery can be found in separate delivery note.  Patient had a routine postpartum course. Patient is discharged home 12/06/19.  Newborn Data: Birth date:12/03/2019  Birth time:9:06 PM  Gender:Female  Living status:Living  Apgars:8 ,9  Weight:3524 g   Magnesium Sulfate received: No BMZ received: No Rhophylac:No MMR:N/A T-DaP:Given prenatally Flu: No Transfusion:No  Physical exam  Vitals:   12/04/19 1424 12/04/19 2103 12/05/19 0515 12/05/19 0906  BP: (!) 138/95 (!) 146/92 (!) 150/97 (!) 149/91  Pulse: 94 94 81 78  Resp: '18 16 16   ' Temp: 98.4 F (36.9 C) 98.1 F (36.7 C) 97.9 F (36.6 C)   TempSrc: Oral Oral Oral   SpO2: 99% 100%    Weight:      Height:       General: alert, cooperative and no distress Lochia: appropriate Uterine Fundus: firm Incision: N/A DVT Evaluation:  No evidence of DVT seen on physical exam. Labs: Lab Results  Component Value Date   WBC 8.5 12/05/2019   HGB 10.7 (L) 12/05/2019   HCT 31.6 (L) 12/05/2019   MCV 87.1 12/05/2019   PLT 110 (L) 12/05/2019   CMP Latest Ref Rng & Units 12/05/2019  Glucose 70 - 99 mg/dL 87  BUN 6 - 20 mg/dL 12  Creatinine 0.44 - 1.00 mg/dL 0.74  Sodium 135 - 145 mmol/L 142  Potassium 3.5 - 5.1 mmol/L 4.1  Chloride 98 - 111 mmol/L 109  CO2 22 - 32 mmol/L 22  Calcium 8.9 - 10.3 mg/dL 8.5(L)  Total Protein 6.5 - 8.1 g/dL 4.5(L)  Total Bilirubin 0.3 - 1.2 mg/dL 0.6  Alkaline Phos 38 - 126 U/L 66  AST 15 - 41 U/L 25  ALT 0 - 44 U/L 17   Edinburgh Score: Edinburgh Postnatal Depression Scale Screening Tool 12/04/2019  I have been able to laugh and see the funny side of things. 0  I have looked forward with enjoyment to things. 0  I have blamed myself unnecessarily when things went wrong. 2  I have been anxious or worried for no good reason. 2  I have felt scared or panicky for no good reason. 2  Things have been getting on top of me. 1  I have been so unhappy that I have had difficulty sleeping. 0  I  have felt sad or miserable. 0  I have been so unhappy that I have been crying. 0  The thought of harming myself has occurred to me. 0  Edinburgh Postnatal Depression Scale Total 7      After visit meds:  Allergies as of 12/05/2019   No Known Allergies     Medication List    STOP taking these medications   aspirin EC 81 MG tablet   nortriptyline 10 MG capsule Commonly known as: PAMELOR   SUMAtriptan 50 MG tablet Commonly known as: IMITREX     TAKE these medications   hydrochlorothiazide 12.5 MG capsule Commonly known as: Microzide Take 1 capsule (12.5 mg total) by mouth daily for 5 days.   ibuprofen 600 MG tablet Commonly known as: ADVIL Take 1 tablet (600 mg total) by mouth every 6 (six) hours as needed.   labetalol 100 MG tablet Commonly known as: NORMODYNE Take 1 tablet (100 mg  total) by mouth 2 (two) times daily.   prenatal multivitamin Tabs tablet Take 1 tablet by mouth daily at 12 noon.        Discharge home in stable condition Infant Feeding: Breast Infant Disposition:home with mother Discharge instruction: per After Visit Summary and Postpartum booklet. Activity: Advance as tolerated. Pelvic rest for 6 weeks.  Diet: low salt diet Anticipated Birth Control: Unsure Postpartum Appointment:6 weeks Additional Postpartum F/U: BP check 1 week Future Appointments:No future appointments. Follow up Visit: Follow-up Information    Azucena Fallen, MD Follow up in 1 week(s).   Specialty: Obstetrics and Gynecology Why: BP check Contact information: [place] [address] [phone]               12/05/2019 Marvene Staff, MD

## 2019-12-05 NOTE — Lactation Note (Addendum)
This note was copied from a baby's chart. Lactation Consultation Note  Patient Name: Glenda Marsh [id number] Date: 12/05/2019   RN will let me know if Mom is interested in seeing lactation. RN to also clarify the type of bottle nipple being used.    Matthias Hughs [facility] 12/05/2019, 8:56 AM  [phone]: RN notified me that Mom does not want to see Lactation. Infant is using the Enfamil Extra Slow-Flow nipple.  Elinor Dodge, RN, IBCLC

## 2019-12-05 NOTE — Progress Notes (Signed)
Pt reports that her scalp is tingling and that her hands feel shaky since first labetalol dose. Spoke with Dr. Garwin Brothers she advised me to tell pt not to take the labetalol anymore upon D/C but to take her HCTZ as prescribed. She also would like pt to call the office on Monday to schedule and apt for a BP check.

## 2019-12-05 NOTE — Progress Notes (Signed)
RN attempted to page on call physician with [facility] to inform of BP's trending higher and increased swelling of legs. Awaiting response.

## 2019-12-05 NOTE — Discharge Instructions (Signed)
Per postpartum booklet

## 2020-04-20 ENCOUNTER — Ambulatory Visit (INDEPENDENT_AMBULATORY_CARE_PROVIDER_SITE_OTHER): Payer: 59 | Admitting: Internal Medicine

## 2020-04-20 ENCOUNTER — Encounter: Payer: Self-pay | Admitting: Internal Medicine

## 2020-04-20 ENCOUNTER — Other Ambulatory Visit: Payer: Self-pay

## 2020-04-20 VITALS — BP 124/76 | HR 74 | Ht 66.0 in | Wt 177.4 lb

## 2020-04-20 DIAGNOSIS — E221 Hyperprolactinemia: Secondary | ICD-10-CM

## 2020-04-20 LAB — TSH: TSH: 1.26 u[IU]/mL (ref 0.35–4.50)

## 2020-04-20 LAB — COMPREHENSIVE METABOLIC PANEL
ALT: 13 U/L (ref 0–35)
AST: 13 U/L (ref 0–37)
Albumin: 4.4 g/dL (ref 3.5–5.2)
Alkaline Phosphatase: 49 U/L (ref 39–117)
BUN: 13 mg/dL (ref 6–23)
CO2: 32 mEq/L (ref 19–32)
Calcium: 9.6 mg/dL (ref 8.4–10.5)
Chloride: 106 mEq/L (ref 96–112)
Creatinine, Ser: 0.82 mg/dL (ref 0.40–1.20)
GFR: 96.3 mL/min (ref >60.00)
Glucose, Bld: 78 mg/dL (ref 70–99)
Potassium: 4.1 mEq/L (ref 3.5–5.1)
Sodium: 144 mEq/L (ref 135–145)
Total Bilirubin: 0.3 mg/dL (ref 0.2–1.2)
Total Protein: 7 g/dL (ref 6.0–8.3)

## 2020-04-20 LAB — PROLACTIN: Prolactin: 126.6 ng/mL — ABNORMAL HIGH

## 2020-04-20 NOTE — Patient Instructions (Signed)
-   Stop by the labs today  

## 2020-04-20 NOTE — Progress Notes (Signed)
Name: Glenda Marsh  MRN/ DOB: [id number], 04-Apr-1991    Age/ Sex: 29 y.o., female    PCP: Jettie Booze, NP   Reason for Endocrinology Evaluation: Hyperprolactinemia     Date of Initial Endocrinology Evaluation: 04/21/2020     HPI: [name] is a 29 y.o. female with a past medical history of arnold chiari malformations. The patient presented for initial endocrinology clinic visit on 04/21/2020 for consultative assistance with her Hyperprolactinemia.     Pt has been diagnosed with Hyperprolactinemia in 2019 , she was evaluated by [facility] and was started on Cabergoline. Her MRI was unrevealing.   She was on 2 tabs weekly and when her prolactin levels became low , she stopped it. She became pregnant shortly after and is S/P delivery of a baby boy in 11/2019  She was unable to breast feed, her last attempt at pumping was ~ 2 months ago.  She has not had any menstruations since delivery.    She was noted to have an elevated prolactin level during follow up at the [facility] office with 126 ng/dL in 03/2020  Has occasional milky discharge   Has been having occasional headaches but no chiari malformation   She is not planning on conceiving soon , has a prescription for OCP's but has not started yet.      HISTORY:  Past Medical History:  Past Medical History:  Diagnosis Date  . Anxiety   . Arnold-Chiari malformation ([place])   . Gestational thrombocytopenia ([place])   . Hyperprolactinemia ([place])   . Hypertension   . Menstrual migraine    Past Surgical History:  Past Surgical History:  Procedure Laterality Date  . NO PAST SURGERIES      Social History:  reports that she has never smoked. She has never used smokeless tobacco. She reports previous alcohol use. She reports that she does not use drugs. Family History: family history includes Bladder Cancer in her maternal grandfather; Diabetes in her maternal grandmother and paternal grandmother;  Healthy in her father and mother; Heart attack in her paternal grandfather; Hypertension in her maternal grandmother.   HOME MEDICATIONS: Allergies as of 04/20/2020   No Known Allergies     Medication List       Accurate as of April 20, 2020 11:59 PM. If you have any questions, ask your nurse or doctor.        STOP taking these medications   ibuprofen 600 MG tablet Commonly known as: ADVIL Stopped by: Dorita Sciara, MD   labetalol 100 MG tablet Commonly known as: NORMODYNE Stopped by: Dorita Sciara, MD   prenatal multivitamin Tabs tablet Stopped by: Dorita Sciara, MD     TAKE these medications   cabergoline 0.5 MG tablet Commonly known as: DOSTINEX Take 0.5 tablets (0.25 mg total) by mouth 2 (two) times a week. Started by: Dorita Sciara, MD   hydrochlorothiazide 12.5 MG capsule Commonly known as: Microzide Take 1 capsule (12.5 mg total) by mouth daily for 5 days.   sertraline 25 MG tablet Commonly known as: ZOLOFT Take 25 mg by mouth daily.         REVIEW OF SYSTEMS: A comprehensive ROS was conducted with the patient and is negative except as per HPI and below:  ROS     OBJECTIVE:  VS: BP 124/76 (BP Location: Right Arm, Patient Position: Sitting, Cuff Size: Small)   Pulse 74   Ht 5\' 6"  (1.676 m)  Wt 177 lb 6.4 oz (80.5 kg)   SpO2 99%   BMI 28.63 kg/m    Wt Readings from Last 3 Encounters:  04/20/20 177 lb 6.4 oz (80.5 kg)  12/03/19 199 lb 12.8 oz (90.6 kg)  10/30/19 193 lb 1.6 oz (87.6 kg)     EXAM: General: Pt appears well and is in NAD  Eyes: External eye exam normal without stare, lid lag or exophthalmos.  EOM intact.  Neck: General: Supple without adenopathy. Thyroid: Thyroid size normal.  No goiter or nodules appreciated. No thyroid bruit.  Lungs: Clear with good BS bilat with no rales, rhonchi, or wheezes  Heart: Auscultation: RRR.  Abdomen: Normoactive bowel sounds, soft, nontender, without masses or  organomegaly palpable  Extremities:  BL LE: No pretibial edema normal ROM and strength.  Skin: Hair: Texture and amount normal with gender appropriate distribution Skin Inspection: No rashes Skin Palpation: Skin temperature, texture, and thickness normal to palpation  Neuro: Cranial nerves: II - XII grossly intact  Motor: Normal strength throughout DTRs: 2+ and symmetric in UE without delay in relaxation phase  Mental Status: Judgment, insight: Intact Orientation: Oriented to time, place, and person Mood and affect: No depression, anxiety, or agitation     DATA REVIEWED:  Results for Glenda, Marsh (MRN [id number]) as of 04/21/2020 13:25  Ref. Range 04/20/2020 14:53  Sodium Latest Ref Range: 135 - 145 mEq/L 144  Potassium Latest Ref Range: 3.5 - 5.1 mEq/L 4.1  Chloride Latest Ref Range: 96 - 112 mEq/L 106  CO2 Latest Ref Range: 19 - 32 mEq/L 32  Glucose Latest Ref Range: 70 - 99 mg/dL 78  BUN Latest Ref Range: 6 - 23 mg/dL 13  Creatinine Latest Ref Range: 0.40 - 1.20 mg/dL 0.82  Calcium Latest Ref Range: 8.4 - 10.5 mg/dL 9.6  Alkaline Phosphatase Latest Ref Range: 39 - 117 U/L 49  Albumin Latest Ref Range: 3.5 - 5.2 g/dL 4.4  AST Latest Ref Range: 0 - 37 U/L 13  ALT Latest Ref Range: 0 - 35 U/L 13  Total Protein Latest Ref Range: 6.0 - 8.3 g/dL 7.0  Total Bilirubin Latest Ref Range: 0.2 - 1.2 mg/dL 0.3  GFR Latest Ref Range: >60.00 mL/min 96.30  Prolactin Latest Units: ng/mL 126.6 (H)  TSH Latest Ref Range: 0.35 - 4.50 uIU/mL 1.26    03/22/2020 Prolactin 126 ng/mL      MRI 07/2018 Brain: Normal size and homogeneous enhancement of the pituitary gland. Normal infundibulum, cavernous sinus region, and chiasm.  Very low and pointed cerebellar tonsils, 2.5 cm below the foramen magnum. There is also descent of the medulla with mild buckling. There is posterior tilting of the dens, hypoplastic appearing occipital condyles, and based on chambers lens line there is borderline  basilar invagination. No visible syrinx. No hydrocephalus.  No infarct, mass,  or collection.  Vascular: Major flow voids and vascular enhancements are preserved.  Skull and upper cervical spine: Skull base description as above.  Sinuses/Orbits: Negative  IMPRESSION: 1. Normal MRI of the pituitary gland. 2. Chiari 1.5 malformation as described. ASSESSMENT/PLAN/RECOMMENDATIONS:   Hyperprolactinemia :  - She is S/P delivery 5 months ago , was unable to breast feed .  - Has been on carbergoline in the past without side effects  - MRI was normal in 2020  Medications : Cabergoline 0.5 mg, half tablet twice a week    F/U in 3 months    Addendum: discussed labs with the pt on 04/21/2020 at 1330 Signed  electronically by: Mack Guise, MD  [facility]  [facility] [address] [place], [place] [address] Phone: [phone] FAX: [phone]   CC: Jettie Booze, NP [place] [address] [place] [address] Phone: [phone] Fax: [phone]   Return to Endocrinology clinic as below: Future Appointments  Date Time Provider [place]  07/25/2020  3:40 PM Lavinia Mcneely, Melanie Crazier, MD [id number] None

## 2020-04-21 MED ORDER — CABERGOLINE 0.5 MG PO TABS: 0.2500 mg | ORAL_TABLET | ORAL | 2 refills | Status: DC

## 2020-07-25 ENCOUNTER — Ambulatory Visit (INDEPENDENT_AMBULATORY_CARE_PROVIDER_SITE_OTHER): Payer: 59 | Admitting: Internal Medicine

## 2020-07-25 ENCOUNTER — Other Ambulatory Visit: Payer: Self-pay

## 2020-07-25 ENCOUNTER — Encounter: Payer: Self-pay | Admitting: Internal Medicine

## 2020-07-25 VITALS — BP 120/72 | HR 82 | Ht 66.0 in | Wt 174.2 lb

## 2020-07-25 DIAGNOSIS — E221 Hyperprolactinemia: Secondary | ICD-10-CM

## 2020-07-25 DIAGNOSIS — M25562 Pain in left knee: Secondary | ICD-10-CM | POA: Diagnosis not present

## 2020-07-25 LAB — PROLACTIN: Prolactin: 5.6 ng/mL

## 2020-07-25 NOTE — Patient Instructions (Signed)
-   Continue Cabergoline , half tablet once weekly for now    Take Aleve 220 mg Twice daily ( breakfast and Supper) and Tylenol as needed in between for the pain . Please ice the knee when you can , if its not any better please check with your primary care physician.

## 2020-07-25 NOTE — Progress Notes (Signed)
Name: Glenda Marsh  MRN/ DOB: [id number], 07-24-1990    Age/ Sex: 30 y.o., female     PCP: Jettie Booze, NP   Reason for Endocrinology Evaluation: Hyperprolactinemia     Initial Endocrinology Clinic Visit: 04/20/2020    PATIENT IDENTIFIER: Glenda Marsh is a 30 y.o., female with a past medical history of arnold chiari malformations. She has followed with [facility] since 04/20/2020  for consultative assistance with management of her hyperprolactinemia.   HISTORICAL SUMMARY:   Pt has been diagnosed with Hyperprolactinemia in 2019 , she was evaluated by [facility] and was started on Cabergoline. Her MRI was unrevealing.  She was on 2 tabs weekly and when her prolactin levels became low , she stopped it. She became pregnant shortly after and is S/P delivery of a baby boy in 11/2019  She was unable to breast feed and 2 months after her last attempt to pump her prolactin level was high at 126 ng/dL through [place] office.     Restarted cabergoline 04/20/2020 SUBJECTIVE:    Today (07/25/2020):  [name] is here for a follow up on hyperprolactinemia.   Denies galactorrhea  She resumed her menstruations Has occasional nausea but no diarrhea, occasional constipation as well as GERD   Has noted left leg pains for the past, no trauma , takes Ibuprofen   Cabergoline 0.5 mg , half a tablet twice weekly - has been taking it once a week     HISTORY:  Past Medical History:  Past Medical History:  Diagnosis Date  . Anxiety   . Arnold-Chiari malformation ([place])   . Gestational thrombocytopenia ([place])   . Hyperprolactinemia ([place])   . Hypertension   . Menstrual migraine    Past Surgical History:  Past Surgical History:  Procedure Laterality Date  . NO PAST SURGERIES     Social History:  reports that she has never smoked. She has never used smokeless tobacco. She reports previous alcohol use. She reports that she does not use  drugs. Family History:  Family History  Problem Relation Age of Onset  . Diabetes Maternal Grandmother   . Hypertension Maternal Grandmother   . Healthy Mother   . Healthy Father   . Bladder Cancer Maternal Grandfather   . Diabetes Paternal Grandmother   . Heart attack Paternal Grandfather      HOME MEDICATIONS: Allergies as of 07/25/2020   No Known Allergies     Medication List       Accurate as of July 25, 2020  3:47 PM. If you have any questions, ask your nurse or doctor.        STOP taking these medications   hydrochlorothiazide 12.5 MG capsule Commonly known as: Microzide Stopped by: Dorita Sciara, MD     TAKE these medications   cabergoline 0.5 MG tablet Commonly known as: DOSTINEX Take 0.5 tablets (0.25 mg total) by mouth 2 (two) times a week.   sertraline 25 MG tablet Commonly known as: ZOLOFT Take 25 mg by mouth daily.         OBJECTIVE:   PHYSICAL EXAM: VS: BP 120/72   Pulse 82   Ht 5\' 6"  (1.676 m)   Wt 174 lb 4 oz (79 kg)   LMP 07/16/2020   SpO2 98%   BMI 28.12 kg/m    EXAM: General: Pt appears well and is in NAD  Lungs: Clear with good BS bilat with no rales, rhonchi, or wheezes  Heart:  Auscultation: RRR.  Abdomen: Normoactive bowel sounds, soft, nontender, without masses or organomegaly palpable  Extremities:  BL LE: No pretibial edema normal ROM and strength. Right medial knee tenderness, had a bruise medially just distal to the left knee . NO swelling noted   Mental Status: Judgment, insight: Intact Orientation: Oriented to time, place, and person Mood and affect: No depression, anxiety, or agitation     DATA REVIEWED: Results for ANJELI, CASAD (MRN [id number]) as of 07/26/2020 12:36  Ref. Range 07/25/2020 15:53  Prolactin Latest Units: ng/mL 5.6      ASSESSMENT / PLAN / RECOMMENDATIONS:   Hyperprolactinemia :   - Clinically she is improving with resolution of galactorrhea and resuming menstruations - pt  advised to use some form of contraception to avoid pregnancy at this time , until prolactin is normalized - Pt advised to stop Cabergoline in the even of pregnancy and to notify us  - Prolactin is normal at 5.6 ng/mL , will stop the half a tablet of cabergoline weekly and recheck in 6 weeks    Medications   Stop Cabergoline    2. Left knee pain :  - No swelling or erythema  - Has tenderness at the medial aspect of the knee , ibuprofen not helping   - Pt advised to take Aleve twice daily and use Tylenol as needed in between.If symptoms are not better, pt to follow with PCP   F/U in 4 months   Signed electronically by: Mack Guise, MD  [facility] Endocrinology  [facility] [place] [facility] [address] [place], [place] [address] Phone: [phone] FAX: [phone]      CC: Jettie Booze, NP [place] [address] [place] [address] Phone: [phone]  Fax: [phone]   Return to Endocrinology clinic as below: No future appointments.

## 2020-07-26 ENCOUNTER — Telehealth: Payer: Self-pay | Admitting: Internal Medicine

## 2020-07-26 DIAGNOSIS — E221 Hyperprolactinemia: Secondary | ICD-10-CM

## 2020-07-26 NOTE — Telephone Encounter (Signed)
Please let the pt know her prolactin is normal and to STOP cabergoline but to schedule a lab appointment in 6 weeks  For a recheck on prolactin     Order is in   Thanks

## 2020-07-26 NOTE — Telephone Encounter (Signed)
Spoken to patient and notified Dr Quin Hoop comments. Verbalized understanding. Lab appt have been schedule on 09/08/2020

## 2020-07-27 NOTE — Telephone Encounter (Signed)
Error

## 2020-08-26 ENCOUNTER — Other Ambulatory Visit (INDEPENDENT_AMBULATORY_CARE_PROVIDER_SITE_OTHER): Payer: 59

## 2020-08-26 ENCOUNTER — Other Ambulatory Visit: Payer: Self-pay

## 2020-08-26 DIAGNOSIS — E221 Hyperprolactinemia: Secondary | ICD-10-CM

## 2020-08-26 LAB — PROLACTIN: Prolactin: 47.7 ng/mL — ABNORMAL HIGH

## 2020-08-29 ENCOUNTER — Telehealth: Payer: Self-pay | Admitting: Internal Medicine

## 2020-08-29 MED ORDER — CABERGOLINE 0.5 MG PO TABS: 0.2500 mg | ORAL_TABLET | ORAL | 1 refills | Status: DC

## 2020-08-29 NOTE — Telephone Encounter (Signed)
Discussed elevation in Prolactin since being off Cabergoline. LMP was 08/28/2020    Will restart half a tablet weekly     Pt expressed understanding     [name], MD  [facility] Endocrin[facility]  [facility] [address] [place], [place] [address] Phone: [phone] FAX: [phone]

## 2020-09-08 ENCOUNTER — Other Ambulatory Visit: Payer: 59

## 2020-12-01 ENCOUNTER — Ambulatory Visit: Payer: 59 | Admitting: Internal Medicine

## 2020-12-19 ENCOUNTER — Encounter: Payer: Self-pay | Admitting: Internal Medicine

## 2021-01-03 ENCOUNTER — Encounter: Payer: Self-pay | Admitting: Internal Medicine

## 2021-01-03 ENCOUNTER — Ambulatory Visit (INDEPENDENT_AMBULATORY_CARE_PROVIDER_SITE_OTHER): Payer: Commercial Managed Care - PPO | Admitting: Internal Medicine

## 2021-01-03 ENCOUNTER — Other Ambulatory Visit: Payer: Self-pay

## 2021-01-03 VITALS — BP 118/72 | HR 78 | Temp 98.3°F | Resp 12 | Ht 66.0 in | Wt 169.6 lb

## 2021-01-03 DIAGNOSIS — D352 Benign neoplasm of pituitary gland: Secondary | ICD-10-CM | POA: Diagnosis not present

## 2021-01-03 LAB — TSH: TSH: 1.27 u[IU]/mL (ref 0.35–4.50)

## 2021-01-03 LAB — T4, FREE: Free T4: 0.83 ng/dL (ref 0.60–1.60)

## 2021-01-03 NOTE — Progress Notes (Signed)
Name: Glenda Marsh  MRN/ DOB: [id number], Dec 22, 1990    Age/ Sex: 31 y.o., female     PCP: Jettie Booze, NP   Reason for Endocrinology Evaluation: Hyperprolactinemia     Initial Endocrinology Clinic Visit: 04/20/2020    PATIENT IDENTIFIER: Glenda Marsh is a 30 y.o., female with a past medical history of arnold chiari malformations. She has followed with [facility] since 04/20/2020  for consultative assistance with management of her hyperprolactinemia.   HISTORICAL SUMMARY:   Pt has been diagnosed with Hyperprolactinemia in 2019 , she was evaluated by [facility] and was started on Cabergoline. Her MRI was unrevealing. (2020) She was on 2 tabs weekly and when her prolactin levels became low , she stopped it. She became pregnant shortly after and is S/P delivery of a baby boy in 11/2019   She was unable to breast feed and 2 months after her last attempt to pump her prolactin level was high at 126 ng/dL through [place] office.     Restarted cabergoline 04/20/2020 , discontinued 07/25/2020 but she was asked to restart in 08/2020 due to elevation of prolactin from 5.6 to 47.7  ng/dL  SUBJECTIVE:    Today (01/03/2021):  Glenda Marsh is here for a follow up on hyperprolactinemia. She has NOT restarted cabergoline    She endorses galactorrhea again that started  last week as well as headaches She also noted irregular periods  that started in 10/2020     HISTORY:  Past Medical History:  Past Medical History:  Diagnosis Date  . Anxiety   . Arnold-Chiari malformation ([place])   . Gestational thrombocytopenia ([place])   . Hyperprolactinemia ([place])   . Hypertension   . Menstrual migraine    Past Surgical History:  Past Surgical History:  Procedure Laterality Date  . NO PAST SURGERIES     Social History:  reports that she has never smoked. She has never used smokeless tobacco. She reports previous alcohol use. She reports that she does not use  drugs. Family History:  Family History  Problem Relation Age of Onset  . Diabetes Maternal Grandmother   . Hypertension Maternal Grandmother   . Healthy Mother   . Healthy Father   . Bladder Cancer Maternal Grandfather   . Diabetes Paternal Grandmother   . Heart attack Paternal Grandfather      HOME MEDICATIONS: Allergies as of 01/03/2021   No Known Allergies      Medication List        Accurate as of January 03, 2021  8:38 AM. If you have any questions, ask your nurse or doctor.          cabergoline 0.5 MG tablet Commonly known as: DOSTINEX Take 0.5 tablets (0.25 mg total) by mouth once a week.   sertraline 25 MG tablet Commonly known as: ZOLOFT Take 25 mg by mouth daily.          OBJECTIVE:   PHYSICAL EXAM: VS: BP 118/72 (BP Location: Right Arm, Cuff Size: Normal)   Pulse 78   Temp 98.3 F (36.8 C)   Resp 12   Ht 5\' 6"  (1.676 m)   Wt 169 lb 9.6 oz (76.9 kg)   LMP 10/14/2020   SpO2 100%   BMI 27.37 kg/m    EXAM: General: Pt appears well and is in NAD  Lungs: Clear with good BS bilat with no rales, rhonchi, or wheezes  Heart: Auscultation: RRR.  Abdomen: Normoactive bowel sounds, soft,  nontender, without masses or organomegaly palpable  Extremities:  BL LE: No pretibial edema normal ROM and strength. Right medial knee tenderness, had a bruise medially just distal to the left knee . NO swelling noted   Mental Status: Judgment, insight: Intact Orientation: Oriented to time, place, and person Mood and affect: No depression, anxiety, or agitation     DATA REVIEWED:  Results for Glenda, Marsh (MRN [id number]) as of 01/04/2021 11:18  Ref. Range 07/25/2020 15:53 08/26/2020 14:34 01/03/2021 08:27  Prolactin Latest Units: ng/mL 5.6 47.7 (H) 39.6 (H)  Results for Glenda, Marsh (MRN [id number]) as of 01/04/2021 11:18  Ref. Range 01/03/2021 08:27  TSH Latest Ref Range: 0.35 - 4.50 uIU/mL 1.27  T4,Free(Direct) Latest Ref Range: 0.60 - 1.60 ng/dL 0.83     ASSESSMENT / PLAN / RECOMMENDATIONS:   Hyperprolactinemia :   - We attempted to stop cabergoline in 07/2020 with normalization of prolactin level at the time, 6 weeks later her prolactin level increase to 47 and she was advised to restart Cabergoline, pt never restarted cabergoline and at this time she is clinically symptomatic with hyperprolactinemia   - Prolactin is elevated but stable, will restart cabergoline as below   Medications   Restart Cabergoline 0.5 mg , half a tablet a week      F/U in 4 months   Signed electronically by: Mack Guise, MD  [facility] Endocrinology  [place] [facility] [address] [place], [place] [address] Phone: [phone] FAX: [phone]      CC: Jettie Booze, NP [place] [address] [place] [address] Phone: [phone]  Fax: [phone]   Return to Endocrinology clinic as below: No future appointments.

## 2021-01-04 DIAGNOSIS — D352 Benign neoplasm of pituitary gland: Secondary | ICD-10-CM | POA: Insufficient documentation

## 2021-01-04 LAB — PROLACTIN: Prolactin: 39.6 ng/mL — ABNORMAL HIGH

## 2021-01-04 MED ORDER — CABERGOLINE 0.5 MG PO TABS: 0.2500 mg | ORAL_TABLET | ORAL | 1 refills | Status: DC

## 2021-01-19 ENCOUNTER — Encounter: Payer: Self-pay | Admitting: Internal Medicine

## 2021-01-19 DIAGNOSIS — Q0700 Arnold-Chiari syndrome without spina bifida or hydrocephalus: Secondary | ICD-10-CM

## 2021-01-20 ENCOUNTER — Telehealth: Payer: Self-pay | Admitting: Internal Medicine

## 2021-01-20 NOTE — Telephone Encounter (Signed)
Left a message for the pt on voice mail on 01/20/2021 at 10 AM and apologized for the Typo of taking cabergoline daily , as it should have been once weekly  She was advised to stop this for 1-2 weeks until symptoms resolve, and restart at once weekly   The office note does state this correctly but not the labs results     [name], MD  [facility] Endocrinology  [facility] [address] [place], [place] [address] Phone: [phone] FAX: [phone]

## 2021-01-24 ENCOUNTER — Telehealth: Payer: Self-pay | Admitting: Neurology

## 2021-01-24 NOTE — Telephone Encounter (Signed)
I returned the call to the patient. Reports waking up with discomfort in the back of neck. Pain worse when she turns her head towards the left. Radiates into left shoulder and up the back of her head. She thinks it may be a neck injury. She was last seen here two years ago for Arnold-Chiari deformity.   She will need to initially see her PCP for a work-up of this new onset of acute pain. They will be able to determine next step. She is agreeable to this plan.

## 2021-01-24 NOTE — Telephone Encounter (Signed)
Pt called needing to speak to the RN to ask and see if an order for an MRI can be put in for her. Please advise.

## 2021-02-06 ENCOUNTER — Other Ambulatory Visit: Payer: Self-pay | Admitting: Internal Medicine

## 2021-02-06 DIAGNOSIS — E221 Hyperprolactinemia: Secondary | ICD-10-CM

## 2021-02-23 ENCOUNTER — Other Ambulatory Visit: Payer: Self-pay | Admitting: Internal Medicine

## 2021-02-23 DIAGNOSIS — F40240 Claustrophobia: Secondary | ICD-10-CM

## 2021-02-23 MED ORDER — LORAZEPAM 0.5 MG PO TABS: 0.5000 mg | ORAL_TABLET | Freq: Every day | ORAL | 0 refills | Status: DC

## 2021-02-23 NOTE — Progress Notes (Signed)
Pt requesting premedication for MRI of brain   Ativan will be sent    Melanie Crazier [facility]

## 2021-02-24 ENCOUNTER — Telehealth: Payer: Self-pay

## 2021-02-24 NOTE — Telephone Encounter (Signed)
-----   Message from Cloyd Stagers, MD sent at 02/23/2021  4:44 PM EDT ----- Regarding: RE: Premedication for MRI PLease let the pt know that I send a prescription for ativan, she can take 1 tablet half an hour before her MRI, she may need to have someone drive her as it may make her dizzy  ----- Message ----- From: Antonieta Loveless Sent: 02/23/2021  11:40 AM EDT To: Cloyd Stagers, MD Subject: Premedication for MRI                          Patient is scheduled for MRI on Saturday August 20th and is requesting to be premedicated for clausterphobia.   Please send in Rx.   Thank you [facility].

## 2021-02-24 NOTE — Telephone Encounter (Signed)
Called and spoke with patient informed her that her  medication was sent to the pharmacy , pt said that it was sent to the wrong pharmacy that she no longer uses the [facility]. I explain to pt that Dr Kelton Pillar was out of the office. She said that she will go to [facility] to get it,I told her that I delete it out  of chart. Pt said that she uses [facility].

## 2021-02-27 ENCOUNTER — Encounter: Payer: Self-pay | Admitting: Internal Medicine

## 2021-02-27 ENCOUNTER — Other Ambulatory Visit: Payer: Self-pay | Admitting: Internal Medicine

## 2021-02-27 MED ORDER — LORAZEPAM 0.5 MG PO TABS: 0.5000 mg | ORAL_TABLET | Freq: Every day | ORAL | 0 refills | Status: DC

## 2021-02-27 NOTE — Telephone Encounter (Signed)
Called and left a detail message for pt informing patient that her rx has been sent to [facility] in [place]. Left message for patient.

## 2021-03-04 ENCOUNTER — Ambulatory Visit (HOSPITAL_BASED_OUTPATIENT_CLINIC_OR_DEPARTMENT_OTHER): Payer: Commercial Managed Care - PPO

## 2021-03-13 ENCOUNTER — Telehealth: Payer: Self-pay | Admitting: Internal Medicine

## 2021-03-13 NOTE — Telephone Encounter (Signed)
Belenda Cruise,   I received an email about this patient's MRI that there was no authorization from the insurance done .   I will forward the email to you. I forwarded it last week to Shirlean Mylar ( She was here from [facility])but I am not sure how far she got with it .     Thanks    [name], MD  [facility] Endocrinology  [facility] [address] [place], [place] [address] Phone: [phone] FAX: [phone]

## 2021-03-18 ENCOUNTER — Other Ambulatory Visit: Payer: Self-pay | Admitting: Internal Medicine

## 2021-03-18 ENCOUNTER — Ambulatory Visit (HOSPITAL_BASED_OUTPATIENT_CLINIC_OR_DEPARTMENT_OTHER)
Admission: RE | Admit: 2021-03-18 | Discharge: 2021-03-18 | Disposition: A | Discharge status: Home / Self Care | Payer: Commercial Managed Care - PPO | Source: Ambulatory Visit | Attending: Internal Medicine | Admitting: Internal Medicine

## 2021-03-18 ENCOUNTER — Ambulatory Visit (HOSPITAL_BASED_OUTPATIENT_CLINIC_OR_DEPARTMENT_OTHER): Admission: RE | Admit: 2021-03-18 | Payer: Commercial Managed Care - PPO | Source: Ambulatory Visit

## 2021-03-18 ENCOUNTER — Other Ambulatory Visit: Payer: Self-pay

## 2021-03-18 DIAGNOSIS — E221 Hyperprolactinemia: Secondary | ICD-10-CM | POA: Diagnosis present

## 2021-04-12 ENCOUNTER — Encounter: Payer: Self-pay | Admitting: Internal Medicine

## 2021-04-18 ENCOUNTER — Encounter: Payer: Self-pay | Admitting: Neurology

## 2021-04-18 ENCOUNTER — Telehealth (INDEPENDENT_AMBULATORY_CARE_PROVIDER_SITE_OTHER): Payer: Commercial Managed Care - PPO | Admitting: Neurology

## 2021-04-18 ENCOUNTER — Telehealth: Payer: Commercial Managed Care - PPO | Admitting: Neurology

## 2021-04-18 DIAGNOSIS — G43709 Chronic migraine without aura, not intractable, without status migrainosus: Secondary | ICD-10-CM | POA: Diagnosis not present

## 2021-04-18 DIAGNOSIS — Q0700 Arnold-Chiari syndrome without spina bifida or hydrocephalus: Secondary | ICD-10-CM | POA: Diagnosis not present

## 2021-04-18 NOTE — Telephone Encounter (Signed)
Pt calling in for a MRI that was scheduled and cancelled due to not having a PA. Please contact ins company to validate this issue. Pt received $3,000 after image done. Pt contact [phone]

## 2021-04-18 NOTE — Progress Notes (Addendum)
ASSESSMENT AND PLAN   Glenda Marsh is a 30 y.o. female   Incidental finding of Arnold-Chiari malformation Chronic migraine headaches             There is no signs of upper cervical, brainstem compression,             Overall stable, only has headache couple times each month, responding well to NSAIDs,  She is to call clinic for new issues Arnold-Chiari malformation    DIAGNOSTIC DATA (LABS, IMAGING, TESTING) - I reviewed patient records, labs, notes, testing and imaging myself where available.   MEDICAL HISTORY: Glenda Marsh is a 30 year old female seen in request by her primary care physician Azucena Fallen for evaluation of chronic migraine, abnormal MRIs,   I have reviewed and summarized the referring note from the referring physician.  She had past medical history of anxiety, taking Zoloft, under good control, also reported history of gestational thrombocytopenia, has improved as well, she missed her menstruation cycle for 6 months, work-up demonstrated elevated prolactin level, she was seen by [facility] medicine, was treated with carbergoline since February 2020, per patient, most recent repeat prolactin level was within normal limits   For evaluation, she received MRI of the brain with without contrast, I personally reviewed the film, there was very low and pointed cerebellar tonsils 2.5 cm below the foramen magnum, there is also descent of the maxilla with mild darkening, there is posterior tilting of the dens, hypoplastic appearing occipital condyles, there is borderline basilar invagination, no visible syrinx, no hydrocephalus,   Patient reported long history of migraine headaches, since elementary school, it is usually triggered by straining, blowing her nose, always located at the left posterior occipital region, lasting 3 to 4 hours, with mild light noise sensitivity, nauseous, there was no significant change of migraine over the past few years, it happened about couple  times each months, responsive to ibuprofen,   She did have uneventful pregnancy and delivery of a healthy boy 2 years ago.   She denies visual loss, no vertigo, no hearing loss, no lateralized motor or sensory deficit, no gait abnormality   UPDATE February 05 2019: She is accompanied by her mother at today's clinical visit, she denies headache, not tried nortriptyline or Imitrex, denied gait abnormality, no dizziness, she is going through endocrinology work-up for her heavy menstruation every 2 weeks   We personally reviewed MRI of brain, no pituitary abnormality, Arnold-Chiari malformation, 2.5 cm below the foramen magnum   MRI of cervical spine protruding 2.5 cm below foramen magnum, with also descent of lower mid shoulder with bulking of the dens, and hypoplasia of occipital condyles, mid basilar invagination's, 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 Location: Provider [place] office, patient, parked vehicle  She is overall stable, there is no new neurological symptoms, she denies frequent headaches, only couple times each months, she had occipital upper nuchal area pressure headaches, no significant nausea, take ibuprofen 2 to 3 tablets to alleviate the symptoms, she denies gait abnormality, no vertigo    Observations/Objective: I have reviewed problem lists, medications, allergies.  I personally reviewed MRI of the brain in September 2022, in comparison to previous MRI of the brain in 2021, continued evidence of Arnold-Chiari malformation, extending over 2 cm through the  crowded foraminal magnum, no evidence of spinal cord or brainstem compression, normal MRI of the brain, no change compared to 2021  Assessment and Plan:   Follow Up Instructions: As needed for new issues     PHYSICAL EXAM:   PHYSICAL EXAMNIATION:  Gen: NAD, conversant, well nourised, well groomed                     Awake, alert, oriented to history taking and casual conversation, no dysarthria, no aphasia, facial symmetric, moving 4 extremities without difficulties.  REVIEW OF SYSTEMS:  Full 14 system review of systems performed and notable only for as above All other review of systems were negative.   ALLERGIES: No Known Allergies  HOME MEDICATIONS: Current Outpatient Medications  Medication Sig Dispense Refill   cabergoline (DOSTINEX) 0.5 MG tablet Take 0.5 tablets (0.25 mg total) by mouth once a week. 13 tablet 1   LORazepam (ATIVAN) 0.5 MG tablet Take 1 tablet (0.5 mg total) by mouth daily. 2 tablet 0   sertraline (ZOLOFT) 25 MG tablet Take 25 mg by mouth daily. (Patient not taking: Reported on 01/03/2021)     No current facility-administered medications for this visit.    PAST MEDICAL HISTORY: Past Medical History:  Diagnosis Date   Anxiety    Arnold-Chiari malformation (HCC)    Gestational thrombocytopenia (HCC)    Hyperprolactinemia (HCC)    Hypertension    Menstrual migraine     PAST SURGICAL HISTORY: Past Surgical History:  Procedure Laterality Date   NO PAST SURGERIES      FAMILY HISTORY: Family History  Problem Relation Age of Onset   Diabetes Maternal Grandmother    Hypertension Maternal Grandmother    Healthy Mother    Healthy Father    Bladder Cancer Maternal Grandfather    Diabetes Paternal Grandmother    Heart attack Paternal Grandfather     SOCIAL HISTORY: Social History   Socioeconomic History   Marital status: Married    Spouse name: Not on file   Number of children: 1   Years of education: 12   Highest education level: High school graduate  Occupational History   Occupation: Occupational psychologist  Tobacco Use   Smoking status: Never   Smokeless tobacco: Never  Substance and Sexual Activity   Alcohol use: Not Currently   Drug use: Never    Sexual activity: Not on file  Other Topics Concern   Not on file  Social History Narrative   Lives at home with husband and child.   Right-handed.   No daily caffeine.   Social Determinants of Health   Financial Resource Strain: Not on file  Food Insecurity: Not on file  Transportation Needs: Not on file  Physical Activity: Not on file  Stress: Not on file  Social Connections: Not on file  Intimate Partner Violence: Not on file      Marcial Pacas, M.D. Ph.D.  [facility] [address], [place], [place] [address] Ph: [phone] Fax: [phone]  CC:  Jettie Booze, NP [place] [address],  [place] [address]  Default, Provider, MD

## 2021-04-19 NOTE — Telephone Encounter (Signed)
Patient was given information and states that she will call insurance to get EOB and see if there is anything that needs to be done.

## 2021-04-21 ENCOUNTER — Encounter: Payer: Self-pay | Admitting: Internal Medicine

## 2021-06-05 ENCOUNTER — Other Ambulatory Visit: Payer: Self-pay | Admitting: Internal Medicine

## 2021-06-05 ENCOUNTER — Encounter: Payer: Self-pay | Admitting: Internal Medicine

## 2021-06-05 DIAGNOSIS — E221 Hyperprolactinemia: Secondary | ICD-10-CM

## 2021-06-06 ENCOUNTER — Other Ambulatory Visit (INDEPENDENT_AMBULATORY_CARE_PROVIDER_SITE_OTHER): Payer: Commercial Managed Care - PPO

## 2021-06-06 ENCOUNTER — Other Ambulatory Visit: Payer: Self-pay

## 2021-06-06 DIAGNOSIS — E221 Hyperprolactinemia: Secondary | ICD-10-CM

## 2021-06-07 LAB — PROLACTIN: Prolactin: 9.5 ng/mL

## 2021-06-12 ENCOUNTER — Encounter: Payer: Self-pay | Admitting: Internal Medicine

## 2021-07-26 ENCOUNTER — Encounter: Payer: Self-pay | Admitting: Internal Medicine

## 2021-09-06 ENCOUNTER — Encounter: Payer: Self-pay | Admitting: Internal Medicine

## 2021-09-06 ENCOUNTER — Ambulatory Visit (INDEPENDENT_AMBULATORY_CARE_PROVIDER_SITE_OTHER): Payer: Commercial Managed Care - PPO | Admitting: Internal Medicine

## 2021-09-06 ENCOUNTER — Other Ambulatory Visit: Payer: Self-pay

## 2021-09-06 VITALS — BP 120/78 | HR 78 | Ht 66.0 in | Wt 169.0 lb

## 2021-09-06 DIAGNOSIS — R42 Dizziness and giddiness: Secondary | ICD-10-CM

## 2021-09-06 DIAGNOSIS — N926 Irregular menstruation, unspecified: Secondary | ICD-10-CM

## 2021-09-06 DIAGNOSIS — E221 Hyperprolactinemia: Secondary | ICD-10-CM | POA: Diagnosis not present

## 2021-09-06 LAB — COMPREHENSIVE METABOLIC PANEL
ALT: 9 U/L (ref 0–35)
AST: 11 U/L (ref 0–37)
Albumin: 4.7 g/dL (ref 3.5–5.2)
Alkaline Phosphatase: 43 U/L (ref 39–117)
BUN: 14 mg/dL (ref 6–23)
CO2: 30 mEq/L (ref 19–32)
Calcium: 9.7 mg/dL (ref 8.4–10.5)
Chloride: 106 mEq/L (ref 96–112)
Creatinine, Ser: 0.75 mg/dL (ref 0.40–1.20)
GFR: 106.42 mL/min (ref >60.00)
Glucose, Bld: 88 mg/dL (ref 70–99)
Potassium: 4.7 mEq/L (ref 3.5–5.1)
Sodium: 140 mEq/L (ref 135–145)
Total Bilirubin: 0.4 mg/dL (ref 0.2–1.2)
Total Protein: 7.2 g/dL (ref 6.0–8.3)

## 2021-09-06 LAB — CBC
HCT: 40.7 % (ref 36.0–46.0)
Hemoglobin: 13.6 g/dL (ref 12.0–15.0)
MCHC: 33.4 g/dL (ref 30.0–36.0)
MCV: 84.4 fl (ref 78.0–100.0)
Platelets: 152 10*3/uL (ref 150.0–400.0)
RBC: 4.83 Mil/uL (ref 3.87–5.11)
RDW: 13.6 % (ref 11.5–15.5)
WBC: 5.7 10*3/uL (ref 4.0–10.5)

## 2021-09-06 LAB — FOLLICLE STIMULATING HORMONE: FSH: 4 m[IU]/mL

## 2021-09-06 LAB — TSH: TSH: 1.59 u[IU]/mL (ref 0.35–5.50)

## 2021-09-06 LAB — VITAMIN D 25 HYDROXY (VIT D DEFICIENCY, FRACTURES): VITD: 17.18 ng/mL — ABNORMAL LOW (ref 30.00–100.00)

## 2021-09-06 LAB — PROLACTIN: Prolactin: 6.8 ng/mL

## 2021-09-06 LAB — T4, FREE: Free T4: 0.87 ng/dL (ref 0.60–1.60)

## 2021-09-06 LAB — ESTRADIOL: Estradiol: 125 pg/mL

## 2021-09-06 LAB — LUTEINIZING HORMONE: LH: 3.96 m[IU]/mL

## 2021-09-06 MED ORDER — VITAMIN D (ERGOCALCIFEROL) 1.25 MG (50000 UNIT) PO CAPS: 50000.0000 [IU] | ORAL_CAPSULE | ORAL | 1 refills | Status: DC

## 2021-09-06 NOTE — Progress Notes (Signed)
Name: Glenda Marsh  MRN/ DOB: [id number], [birth year]    Age/ Sex: 31 y.o., female     PCP: Pcp, No   Reason for Endocrinology Evaluation: Hyperprolactinemia     Initial Endocrinology Clinic Visit: 04/20/2020    PATIENT IDENTIFIER: Glenda Marsh is a 31 y.o., female with a past medical history of arnold chiari malformations. She has followed with [facility] since 04/20/2020  for consultative assistance with management of her hyperprolactinemia.   HISTORICAL SUMMARY:   Pt has been diagnosed with Hyperprolactinemia in 2019 , she was evaluated by [place] [facility] and was started on Cabergoline. Her MRI was unrevealing. (2020) She was on 2 tabs weekly and when her prolactin levels became low , she stopped it. She became pregnant shortly after and is S/P delivery of a baby boy in 11/2019   She was unable to breast feed and 2 months after her last attempt to pump her prolactin level was high at 126 ng/dL through [place] office.     Restarted cabergoline 04/20/2020 , discontinued 07/25/2020 but she was asked to restart in 08/2020 due to elevation of prolactin from 5.6 to 47.7  ng/dL  SUBJECTIVE:    Today (09/06/2021):  [name] is here for a follow up on hyperprolactinemia.   She was seen by neurology 04/2021 Denies galactorrhea  LMP 2 weeks ago . Has menstruations every 2 weeks for a while  Sees Gyn  Husband with vasectomy  Has rare headaches, with visual changes , past due for eye exam  Has intermittent  nausea at times in the morning and at times in the evening  Tries to stay hydrated  Does not sleep at night  Does not use cannabinoid products  No Tums or antihistamine  She is not on vitamins , eats some chicken    Cabergoline 0.5 mg, half a tablet weekly        HISTORY:  Past Medical History:  Past Medical History:  Diagnosis Date   Anxiety    Arnold-Chiari malformation ([place])    Gestational thrombocytopenia ([place])     Hyperprolactinemia ([place])    Hypertension    Menstrual migraine    Past Surgical History:  Past Surgical History:  Procedure Laterality Date   NO PAST SURGERIES     Social History:  reports that she has never smoked. She has never used smokeless tobacco. She reports that she does not currently use alcohol. She reports that she does not use drugs. Family History:  Family History  Problem Relation Age of Onset   Diabetes Maternal Grandmother    Hypertension Maternal Grandmother    Healthy Mother    Healthy Father    Bladder Cancer Maternal Grandfather    Diabetes Paternal Grandmother    Heart attack Paternal Grandfather      HOME MEDICATIONS: Allergies as of 09/06/2021   No Known Allergies      Medication List        Accurate as of September 06, 2021  8:33 AM. If you have any questions, ask your nurse or doctor.          STOP taking these medications    LORazepam 0.5 MG tablet Commonly known as: Ativan Stopped by: Dorita Sciara, MD   sertraline 25 MG tablet Commonly known as: ZOLOFT Stopped by: Dorita Sciara, MD       TAKE these medications    cabergoline 0.5 MG tablet Commonly known as: DOSTINEX Take 0.5 tablets (0.25 mg  total) by mouth once a week.          OBJECTIVE:   PHYSICAL EXAM: VS: BP 120/78 (BP Location: Left Arm, Patient Position: Sitting, Cuff Size: Small)    Pulse 78    Ht 5\' 6"  (1.676 m)    Wt 169 lb (76.7 kg)    SpO2 96%    BMI 27.28 kg/m    EXAM: General: Pt appears well and is in NAD  Lungs: Clear with good BS bilat with no rales, rhonchi, or wheezes  Heart: Auscultation: RRR.  Abdomen: Normoactive bowel sounds, soft, nontender, without masses or organomegaly palpable  Extremities:  BL LE: No pretibial edema normal ROM and strength. Right medial knee tenderness, had a bruise medially just distal to the left knee . NO swelling noted   Mental Status: Judgment, insight: Intact Orientation: Oriented to time, place,  and person Mood and affect: No depression, anxiety, or agitation     DATA REVIEWED:  Results for Marsh, KETTLEWELL (MRN [id number]) as of 01/04/2021 11:18  Ref. Range 07/25/2020 15:53 08/26/2020 14:34 01/03/2021 08:27  Prolactin Latest Units: ng/mL 5.6 47.7 (H) 39.6 (H)  Results for Marsh, HARRINGTON (MRN [id number]) as of 01/04/2021 11:18  Ref. Range 01/03/2021 08:27  TSH Latest Ref Range: 0.35 - 4.50 uIU/mL 1.27  T4,Free(Direct) Latest Ref Range: 0.60 - 1.60 ng/dL 0.83   MRI brain 03/18/2021  Brain: Stable appearance of Chiari malformation. Cerebellar tonsils are pointed and extend at least 2 cm below the carotid foramen magnum. Ventricles are of normal size. No significant white matter lesions are present. No acute infarct, hemorrhage, or mass lesion is present. The ventricles are of normal size.   Dedicated imaging of the pituitary demonstrates slight asymmetry of the gland on the left, potentially within normal limits. Pituitary stalk is midline. Optic chiasm within normal limits. Cavernous sinus is normal bilaterally.   Vascular: Flow is present in the major intracranial arteries.   Skull and upper cervical spine: Borderline platybasia is present without definite basilar invagination. There is kinking of the brainstem over the dens.   Sinuses/Orbits: The globes and orbits are within normal limits. The paranasal sinuses and mastoid air cells are clear.   IMPRESSION: 1. Stable appearance of Chiari malformation extending over 2 cm through a crowded foramen magnum. 2. Borderline platybasia. The dens results in some kinking of the brainstem/upper cervical spine 3. Normal MRI appearance of the brain.  ASSESSMENT / PLAN / RECOMMENDATIONS:   Hyperprolactinemia :   - We attempted to stop cabergoline in 07/2020 with normalization of prolactin level at the time, 6 weeks later her prolactin level increase to 47 and she was advised to restart Cabergoline - MRI 03/2021 was non  revealing   - Prolactin is normal    Medications   Continue Cabergoline 0.5 mg , half a tablet a week    2.  Vitamin D deficiency:   - Will replenish    Ergocalciferol 50,000 iu weekly    3. Dizziness :   - No evidence of anemia , LFT's normal    4. Irregular menstruations :  - Pt with menstrual bleeds every 2 weeks, pt to follow up with GYN - [facility]  and estradiol normal     F/U in 6 months   Signed electronically by: Mack Guise, MD  [facility]  [facility] [address] [place], [place] [address] Phone: [phone] FAX: [phone]      CC: Pcp, No No  address on file Phone: None  Fax: None   Return to Endocrinology clinic as below: No future appointments.

## 2021-09-07 MED ORDER — CABERGOLINE 0.5 MG PO TABS: 0.2500 mg | ORAL_TABLET | ORAL | 3 refills | Status: DC

## 2021-10-30 ENCOUNTER — Encounter: Payer: Self-pay | Admitting: Internal Medicine

## 2021-11-16 ENCOUNTER — Encounter: Payer: Self-pay | Admitting: Internal Medicine

## 2021-12-11 ENCOUNTER — Other Ambulatory Visit: Payer: Self-pay | Admitting: Internal Medicine

## 2021-12-18 ENCOUNTER — Other Ambulatory Visit: Payer: Self-pay | Admitting: Internal Medicine

## 2022-03-06 NOTE — Progress Notes (Unsigned)
Name: Glenda Marsh  MRN/ DOB: [id number], 05/12/1991    Age/ Sex: 31 y.o., female     PCP: Pcp, No   Reason for Endocrinology Evaluation: Hyperprolactinemia     Initial Endocrinology Clinic Visit: 04/20/2020    PATIENT IDENTIFIER: Glenda Marsh is a 31 y.o., female with a past medical history of arnold chiari malformations. She has followed with [facility] since 04/20/2020  for consultative assistance with management of her hyperprolactinemia.   HISTORICAL SUMMARY:   Pt has been diagnosed with Hyperprolactinemia in 2019 , she was evaluated by [place] [facility] and was started on Cabergoline. Her MRI was unrevealing. (2020) She was on 2 tabs weekly and when her prolactin levels became low , she stopped it. She became pregnant shortly after and is S/P delivery of a baby boy in 11/2019   She was unable to breast feed and 2 months after her last attempt to pump her prolactin level was high at 126 ng/dL through [place] office.     Restarted cabergoline 04/20/2020 , discontinued 07/25/2020 but she was asked to restart in 08/2020 due to elevation of prolactin from 5.6 to 47.7  ng/dL  SUBJECTIVE:    Today (03/06/2022):  [name] is here for a follow up on hyperprolactinemia.   She was seen by neurology 04/2021 Denies galactorrhea  LMP 2 weeks ago . Has menstruations every 2 weeks for a while  Sees Gyn  Husband with vasectomy  Has rare headaches, with visual changes , past due for eye exam  Has intermittent  nausea at times in the morning and at times in the evening  Tries to stay hydrated  Does not sleep at night  Does not use cannabinoid products  No Tums or antihistamine  She is not on vitamins , eats some chicken    Cabergoline 0.5 mg, half a tablet weekly        HISTORY:  Past Medical History:  Past Medical History:  Diagnosis Date   Anxiety    Arnold-Chiari malformation ([place])    Gestational thrombocytopenia ([place])     Hyperprolactinemia ([place])    Hypertension    Menstrual migraine    Past Surgical History:  Past Surgical History:  Procedure Laterality Date   NO PAST SURGERIES     Social History:  reports that she has never smoked. She has never used smokeless tobacco. She reports that she does not currently use alcohol. She reports that she does not use drugs. Family History:  Family History  Problem Relation Age of Onset   Diabetes Maternal Grandmother    Hypertension Maternal Grandmother    Healthy Mother    Healthy Father    Bladder Cancer Maternal Grandfather    Diabetes Paternal Grandmother    Heart attack Paternal Grandfather      HOME MEDICATIONS: Allergies as of 03/07/2022   No Known Allergies      Medication List        Accurate as of March 06, 2022  7:31 AM. If you have any questions, ask your nurse or doctor.          cabergoline 0.5 MG tablet Commonly known as: DOSTINEX Take 0.5 tablets (0.25 mg total) by mouth once a week.   Vitamin D (Ergocalciferol) 1.25 MG (50000 UNIT) Caps capsule Commonly known as: DRISDOL TAKE 1 CAPSULE BY MOUTH EVERY 7 DAYS          OBJECTIVE:   PHYSICAL EXAM: VS: There were no vitals taken  for this visit.   EXAM: General: Pt appears well and is in NAD  Lungs: Clear with good BS bilat with no rales, rhonchi, or wheezes  Heart: Auscultation: RRR.  Abdomen: Normoactive bowel sounds, soft, nontender, without masses or organomegaly palpable  Extremities:  BL LE: No pretibial edema normal ROM and strength. Right medial knee tenderness, had a bruise medially just distal to the left knee . NO swelling noted   Mental Status: Judgment, insight: Intact Orientation: Oriented to time, place, and person Mood and affect: No depression, anxiety, or agitation     DATA REVIEWED:  Results for BRYTTANY, TORTORELLI (MRN [id number]) as of 01/04/2021 11:18  Ref. Range 07/25/2020 15:53 08/26/2020 14:34 01/03/2021 08:27  Prolactin Latest Units: ng/mL  5.6 47.7 (H) 39.6 (H)  Results for VALARIE, FARACE (MRN [id number]) as of 01/04/2021 11:18  Ref. Range 01/03/2021 08:27  TSH Latest Ref Range: 0.35 - 4.50 uIU/mL 1.27  T4,Free(Direct) Latest Ref Range: 0.60 - 1.60 ng/dL 0.83   MRI brain 03/18/2021  Brain: Stable appearance of Chiari malformation. Cerebellar tonsils are pointed and extend at least 2 cm below the carotid foramen magnum. Ventricles are of normal size. No significant white matter lesions are present. No acute infarct, hemorrhage, or mass lesion is present. The ventricles are of normal size.   Dedicated imaging of the pituitary demonstrates slight asymmetry of the gland on the left, potentially within normal limits. Pituitary stalk is midline. Optic chiasm within normal limits. Cavernous sinus is normal bilaterally.   Vascular: Flow is present in the major intracranial arteries.   Skull and upper cervical spine: Borderline platybasia is present without definite basilar invagination. There is kinking of the brainstem over the dens.   Sinuses/Orbits: The globes and orbits are within normal limits. The paranasal sinuses and mastoid air cells are clear.   IMPRESSION: 1. Stable appearance of Chiari malformation extending over 2 cm through a crowded foramen magnum. 2. Borderline platybasia. The dens results in some kinking of the brainstem/upper cervical spine 3. Normal MRI appearance of the brain.  ASSESSMENT / PLAN / RECOMMENDATIONS:   Hyperprolactinemia :   - We attempted to stop cabergoline in 07/2020 with normalization of prolactin level at the time, 6 weeks later her prolactin level increase to 47 and she was advised to restart Cabergoline - MRI 03/2021 was non  revealing  - Prolactin is normal    Medications   Continue Cabergoline 0.5 mg , half a tablet a week    2.  Vitamin D deficiency:   - Will replenish    Ergocalciferol 50,000 iu weekly    3. Dizziness :   - No evidence of anemia , LFT's  normal    4. Irregular menstruations :  - Pt with menstrual bleeds every 2 weeks, pt to follow up with GYN - [place]  and estradiol normal     F/U in 6 months   Signed electronically by: Mack Guise, MD  [facility] End[facility]in[facility]  [facility] [address] [place], [place] [address] Phone: [phone] FAX: [phone]      CC: Pcp, No No address on file Phone: None  Fax: None   Return to Endocrinology clinic as below: Future Appointments  Date Time Provider [place]  03/07/2022  8:30 AM Rockne Dearinger, Melanie Crazier, MD [id number] None

## 2022-03-07 ENCOUNTER — Ambulatory Visit (INDEPENDENT_AMBULATORY_CARE_PROVIDER_SITE_OTHER): Payer: Commercial Managed Care - PPO | Admitting: Internal Medicine

## 2022-03-07 ENCOUNTER — Encounter: Payer: Self-pay | Admitting: Internal Medicine

## 2022-03-07 VITALS — BP 120/76 | HR 77 | Ht 66.0 in | Wt 172.6 lb

## 2022-03-07 DIAGNOSIS — E221 Hyperprolactinemia: Secondary | ICD-10-CM

## 2022-03-07 DIAGNOSIS — E559 Vitamin D deficiency, unspecified: Secondary | ICD-10-CM | POA: Diagnosis not present

## 2022-03-07 LAB — VITAMIN D 25 HYDROXY (VIT D DEFICIENCY, FRACTURES): VITD: 55.24 ng/mL (ref 30.00–100.00)

## 2022-03-08 LAB — PROLACTIN: Prolactin: 4 ng/mL

## 2022-03-08 MED ORDER — CABERGOLINE 0.5 MG PO TABS
0.2500 mg | ORAL_TABLET | ORAL | 3 refills | Status: DC
Start: 2022-03-08 — End: 2023-03-14

## 2022-12-24 IMAGING — MR MR HEAD W/O CM
8 of 9 series · 30 of 48 positions shown · non-contrast
Comparison: MR head without and with contrast 07/28/2018. MR
cervical spine 11/18/2018

CLINICAL DATA: Infertility. Galactorrhea. Arnold-Chiari
malformation. Progressive occipital and posterior neck pain. The
patient refused contrast.

EXAM:
MRI HEAD WITHOUT CONTRAST
TECHNIQUE: Multiplanar, multiecho pulse sequences of the brain and surrounding
structures were obtained without intravenous contrast.

[Series 2: T1 · sagittal · 5.0mm · 0.94mm/px · 2 of 27 slices shown (1 of 3)]
[im 1/27]
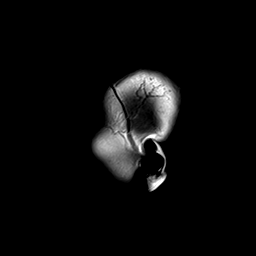
[im 27/27]
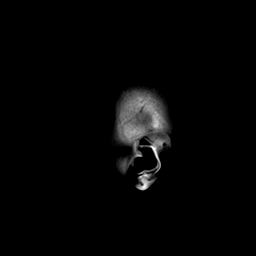

[Series 3: DWI · axial · 3.0mm · 2.19mm/px · z∈[-79,+80]mm · 9 of 80 slices shown (1 of 2)]
[im 1/80]
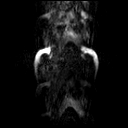
[im 10/80]
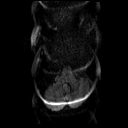
[im 20/80]
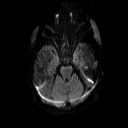
[im 30/80]
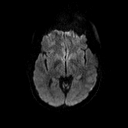
[im 40/80]
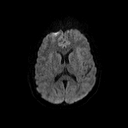
[im 50/80]
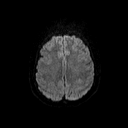
[im 60/80]
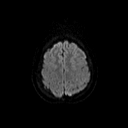
[im 70/80]
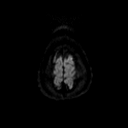
[im 80/80]
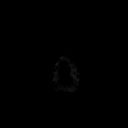

[Series 4: DWI · axial · 3.0mm · 2.19mm/px · z∈[-79,+80]mm · 6 of 50 slices shown (2 of 2)]
[im 1/50]
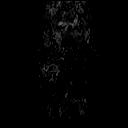
[im 10/50]
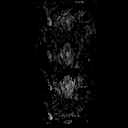
[im 20/50]
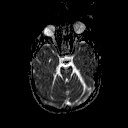
[im 30/50]
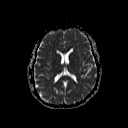
[im 40/50]
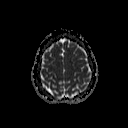
[im 50/50]
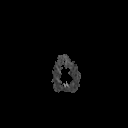

[Series 5: T2 · axial · 5.0mm · 0.72mm/px · z∈[-91,+72]mm · 3 of 25 slices shown (1 of 2)]
[im 1/25]
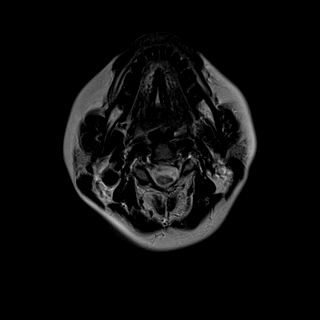
[im 13/25]
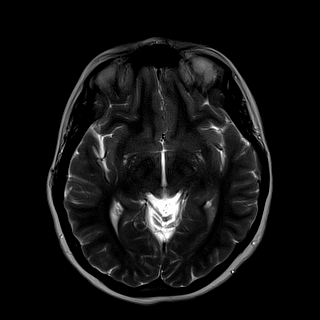
[im 25/25]
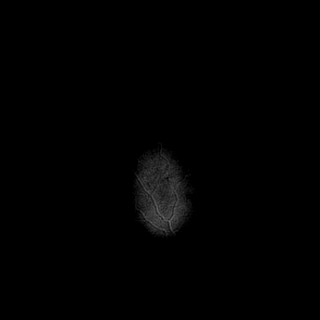

[Series 6: FLAIR · axial · 3.0mm · 0.45mm/px · z∈[-91,+73]mm · 3 of 29 slices shown]
[im 1/29]
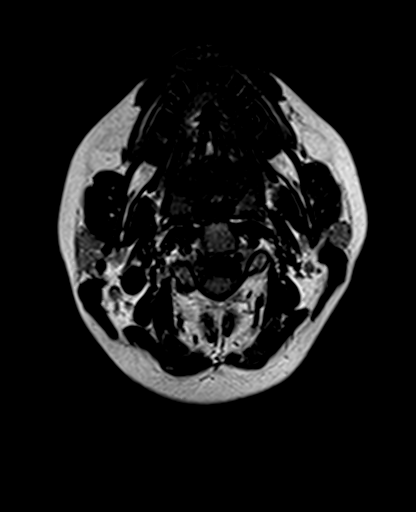
[im 15/29]
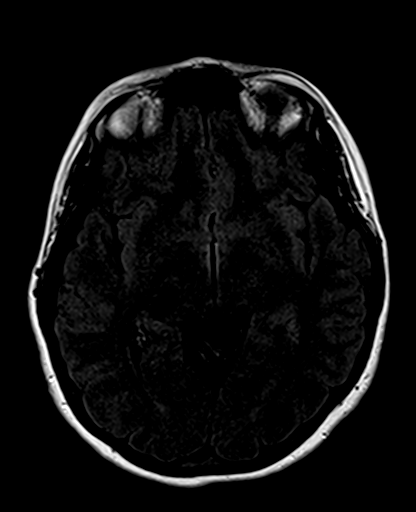
[im 29/29]
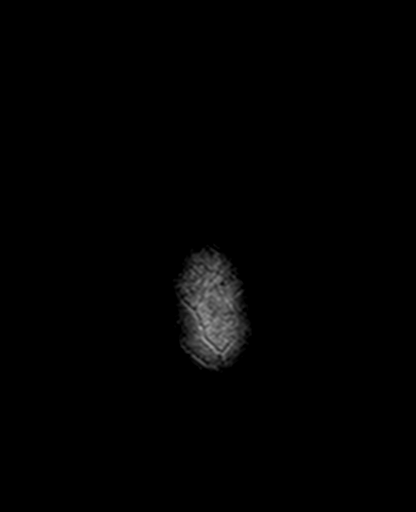

[Series 7: T2 · axial · 5.0mm · 0.90mm/px · z∈[-94,+76]mm · 3 of 30 slices shown (2 of 2)]
[im 1/30]
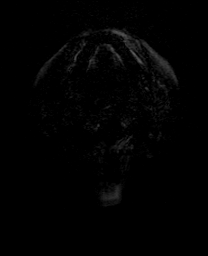
[im 15/30]
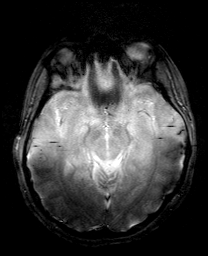
[im 30/30]
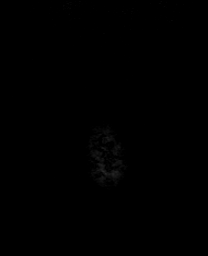

[Series 8: T1 · sagittal · 3.0mm · 0.50mm/px · 2 of 19 slices shown (2 of 3)]
[im 1/19]
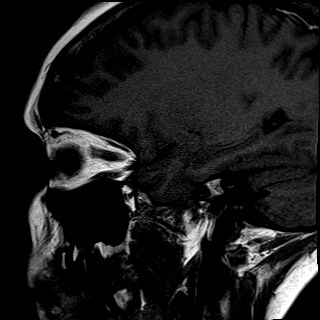
[im 19/19]
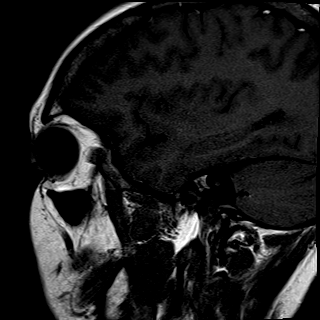

[Series 9: T1 · coronal · 3.0mm · 0.31mm/px · 2 of 15 slices shown (3 of 3)]
[im 1/15]
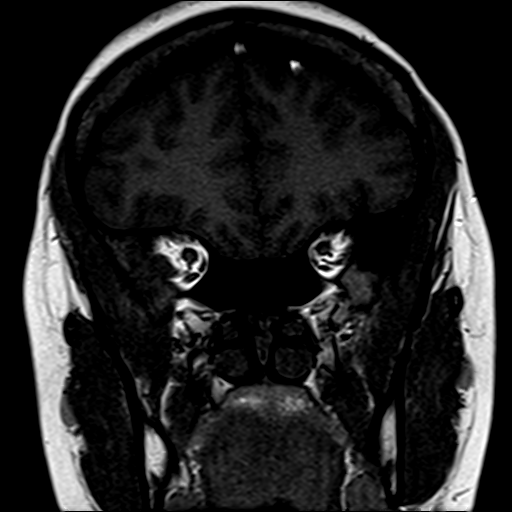
[im 15/15]
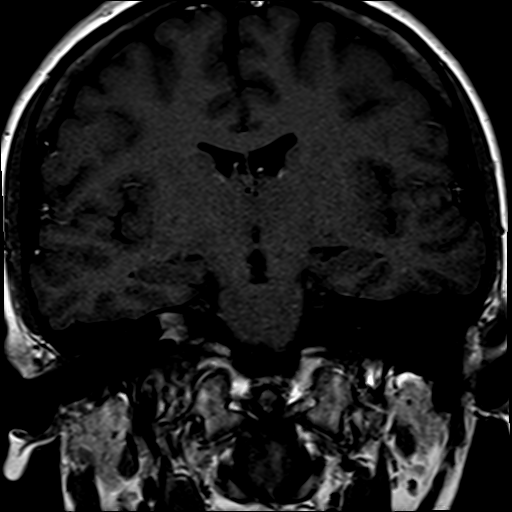

[30 of 48 positions shown; findings below may reference images not displayed]

FINDINGS: Brain: Stable appearance of Chiari malformation. Cerebellar tonsils
are pointed and extend at least 2 cm below the carotid foramen
magnum. Ventricles are of normal size. No significant white matter
lesions are present. No acute infarct, hemorrhage, or mass lesion is
present. The ventricles are of normal size.

Dedicated imaging of the pituitary demonstrates slight asymmetry of
the gland on the left, potentially within normal limits. Pituitary
stalk is midline. Optic chiasm within normal limits. Cavernous sinus
is normal bilaterally.

Vascular: Flow is present in the major intracranial arteries.

Skull and upper cervical spine: Borderline platybasia is present
without definite basilar invagination. There is kinking of the
brainstem over the dens.

Sinuses/Orbits: The globes and orbits are within normal limits. The
paranasal sinuses and mastoid air cells are clear.
IMPRESSION: 1. Stable appearance of Chiari malformation extending over 2 cm
through a crowded foramen magnum.
2. Borderline platybasia. The dens results in some kinking of the
brainstem/upper cervical spine
3. Normal MRI appearance of the brain.

## 2023-03-13 ENCOUNTER — Encounter: Payer: Self-pay | Admitting: Internal Medicine

## 2023-03-13 ENCOUNTER — Ambulatory Visit (INDEPENDENT_AMBULATORY_CARE_PROVIDER_SITE_OTHER): Payer: Commercial Managed Care - PPO | Admitting: Internal Medicine

## 2023-03-13 VITALS — BP 110/70 | HR 82 | Ht 66.0 in | Wt 178.0 lb

## 2023-03-13 DIAGNOSIS — E221 Hyperprolactinemia: Secondary | ICD-10-CM

## 2023-03-13 DIAGNOSIS — E559 Vitamin D deficiency, unspecified: Secondary | ICD-10-CM | POA: Diagnosis not present

## 2023-03-13 LAB — COMPREHENSIVE METABOLIC PANEL
ALT: 9 U/L (ref 0–35)
AST: 11 U/L (ref 0–37)
Albumin: 4.5 g/dL (ref 3.5–5.2)
Alkaline Phosphatase: 47 U/L (ref 39–117)
BUN: 16 mg/dL (ref 6–23)
CO2: 28 mEq/L (ref 19–32)
Calcium: 9.6 mg/dL (ref 8.4–10.5)
Chloride: 104 mEq/L (ref 96–112)
Creatinine, Ser: 0.78 mg/dL (ref 0.40–1.20)
GFR: 100.45 mL/min (ref >60.00)
Glucose, Bld: 90 mg/dL (ref 70–99)
Potassium: 4.9 mEq/L (ref 3.5–5.1)
Sodium: 138 mEq/L (ref 135–145)
Total Bilirubin: 0.5 mg/dL (ref 0.2–1.2)
Total Protein: 7.2 g/dL (ref 6.0–8.3)

## 2023-03-13 LAB — VITAMIN D 25 HYDROXY (VIT D DEFICIENCY, FRACTURES): VITD: 32.24 ng/mL (ref 30.00–100.00)

## 2023-03-13 NOTE — Progress Notes (Unsigned)
Name: Glenda Marsh  MRN/ DOB: [id number], Oct 16, 1990    Age/ Sex: 32 y.o., female     PCP: Glenda Chimera, MD   Reason for Endocrinology Evaluation: Hyperprolactinemia     Initial Endocrinology Clinic Visit: 04/20/2020    PATIENT IDENTIFIER: Glenda Marsh is a 32 y.o., female with a past medical history of arnold chiari malformations. She has followed with [place] [place] [facility] since 04/20/2020  for consultative assistance with management of her hyperprolactinemia.   HISTORICAL SUMMARY:   Pt has been diagnosed with Hyperprolactinemia in 2019 , she was evaluated by [facility] and was started on Cabergoline. Her MRI was unrevealing. (2020) She was on 2 tabs weekly and when her prolactin levels became low , she stopped it. She became pregnant shortly after and is S/P delivery of a baby boy in 11/2019   She was unable to breast feed and 2 months after her last attempt to pump her prolactin level was high at 126 ng/dL through [place] office.     Restarted cabergoline 04/20/2020 , discontinued 07/25/2020 but she was asked to restart in 08/2020 due to elevation of prolactin from 5.6 to 47.7  ng/dL  SUBJECTIVE:    Today (03/13/2023):  Glenda Marsh is here for a follow up on hyperprolactinemia.   Headaches are stable Denies galactorrhea  LMP  02/26/2023 Husband with vasectomy  Up to date on eye exam   Denies nausea or vomiting  Denies constipation, nor diarrhea   She is on Vitamin D 2000 international unit daily  Cabergoline 0.5 mg, half a tablet weekly        HISTORY:  Past Medical History:  Past Medical History:  Diagnosis Date   Anxiety    Arnold-Chiari malformation (HCC)    Gestational thrombocytopenia (HCC)    Hyperprolactinemia (HCC)    Hypertension    Menstrual migraine    Past Surgical History:  Past Surgical History:  Procedure Laterality Date   NO PAST SURGERIES     Social History:  reports that she has never smoked. She has  never used smokeless tobacco. She reports that she does not currently use alcohol. She reports that she does not use drugs. Family History:  Family History  Problem Relation Age of Onset   Diabetes Maternal Grandmother    Hypertension Maternal Grandmother    Healthy Mother    Healthy Father    Bladder Cancer Maternal Grandfather    Diabetes Paternal Grandmother    Heart attack Paternal Grandfather      HOME MEDICATIONS: Allergies as of 03/13/2023   No Known Allergies      Medication List        Accurate as of March 13, 2023  6:56 AM. If you have any questions, ask your nurse or doctor.          cabergoline 0.5 MG tablet Commonly known as: DOSTINEX Take 0.5 tablets (0.25 mg total) by mouth once a week.   Vitamin D (Ergocalciferol) 1.25 MG (50000 UNIT) Caps capsule Commonly known as: DRISDOL TAKE 1 CAPSULE BY MOUTH EVERY 7 DAYS          OBJECTIVE:   PHYSICAL EXAM: VS: BP 110/70 (BP Location: Left Arm, Patient Position: Sitting, Cuff Size: Large)   Pulse 82   Ht 5\' 6"  (1.676 m)   Wt 178 lb (80.7 kg)   SpO2 93%   BMI 28.73 kg/m    EXAM: General: Pt appears well and is in NAD  Lungs: Clear  with good BS bilat  Heart: Auscultation: RRR.  Abdomen:  soft, nontender  Extremities:  BL LE: No pretibial edema    Mental Status: Judgment, insight: Intact Orientation: Oriented to time, place, and person Mood and affect: No depression, anxiety, or agitation     DATA REVIEWED:  Latest Reference Range & Units 03/13/23 08:27  Sodium 135 - 145 mEq/L 138  Potassium 3.5 - 5.1 mEq/L 4.9  Chloride 96 - 112 mEq/L 104  CO2 19 - 32 mEq/L 28  Glucose 70 - 99 mg/dL 90  BUN 6 - 23 mg/dL 16  Creatinine 1.61 - 0.96 mg/dL 0.45  Calcium 8.4 - 40.9 mg/dL 9.6  Alkaline Phosphatase 39 - 117 U/L 47  Albumin 3.5 - 5.2 g/dL 4.5  AST 0 - 37 U/L 11  ALT 0 - 35 U/L 9  Total Protein 6.0 - 8.3 g/dL 7.2  Total Bilirubin 0.2 - 1.2 mg/dL 0.5  GFR >81.19 mL/min 100.45  VITD 30.00  - 100.00 ng/mL 32.24    Latest Reference Range & Units 03/13/23 08:27  Prolactin ng/mL 3.9    MRI brain 03/18/2021  Brain: Stable appearance of Chiari malformation. Cerebellar tonsils are pointed and extend at least 2 cm below the carotid foramen magnum. Ventricles are of normal size. No significant white matter lesions are present. No acute infarct, hemorrhage, or mass lesion is present. The ventricles are of normal size.   Dedicated imaging of the pituitary demonstrates slight asymmetry of the gland on the left, potentially within normal limits. Pituitary stalk is midline. Optic chiasm within normal limits. Cavernous sinus is normal bilaterally.   Vascular: Flow is present in the major intracranial arteries.   Skull and upper cervical spine: Borderline platybasia is present without definite basilar invagination. There is kinking of the brainstem over the dens.   Sinuses/Orbits: The globes and orbits are within normal limits. The paranasal sinuses and mastoid air cells are clear.   IMPRESSION: 1. Stable appearance of Chiari malformation extending over 2 cm through a crowded foramen magnum. 2. Borderline platybasia. The dens results in some kinking of the brainstem/upper cervical spine 3. Normal MRI appearance of the brain.  ASSESSMENT / PLAN / RECOMMENDATIONS:   Hyperprolactinemia :   - We attempted to stop cabergoline in 07/2020 with normalization of prolactin level at the time, 6 weeks later her prolactin level increase to 47 and she was advised to restart Cabergoline - MRI 03/2021 was non revealing except for left pituitary asymmetry which is though to be normal  - Prolactin remains normal  -CMP normal   Medications   Continue Cabergoline 0.5 mg , half a tablet a week    2.  Vitamin D deficiency:   -Resolved,    vitamin D3 2000 IU daily     F/U in 6 months   Signed electronically by: Lyndle Herrlich, MD  [facility] Endocrinology  [facility] [address] [place], [place] [address] Phone: [phone] FAX: [phone]      CC: Glenda Chimera, MD [address] [place] [address] Phone: [phone]  Fax: [phone]   Return to Endocrinology clinic as below: Future Appointments  Date Time Provider Department Center  03/13/2023  8:30 AM Kijana Estock, Konrad Dolores, MD [id number] None

## 2023-03-14 LAB — PROLACTIN: Prolactin: 3.9 ng/mL

## 2023-03-14 MED ORDER — CABERGOLINE 0.5 MG PO TABS
0.2500 mg | ORAL_TABLET | ORAL | 3 refills | Status: DC
Start: 2023-03-14 — End: 2024-01-30

## 2023-07-25 ENCOUNTER — Encounter: Payer: Self-pay | Admitting: Internal Medicine

## 2023-07-25 ENCOUNTER — Ambulatory Visit: Payer: Commercial Managed Care - PPO | Attending: Internal Medicine | Admitting: Internal Medicine

## 2023-07-25 VITALS — BP 118/76 | HR 87 | Ht 66.0 in | Wt 183.0 lb

## 2023-07-25 DIAGNOSIS — R002 Palpitations: Secondary | ICD-10-CM

## 2023-07-25 NOTE — Progress Notes (Signed)
 Cardiology Office Note  Date: 07/25/2023   ID: Glenda Marsh, DOB Sep 10, 1990, MRN [id number]  PCP:  Toribio Jerel MATSU, MD  Cardiologist:  None Electrophysiologist:  None   History of Present Illness: Glenda Marsh is a 33 y.o. female  Patient was referred to cardiology clinic for evaluation palpitations.  She had palpitations consistently for 1 week in September/October 2024 where she felt her heart is skipping a beat.  It used to occur multiple times throughout the day.  She also noticed that this happened around her menstrual cycle.  After September/October 24, she had 1 to 2 g of palpitations so far.  No symptoms of syncope, dizziness.  No other symptoms of chest pain, SOB, leg swelling.   Past Medical History:  Diagnosis Date   Anxiety    Arnold-Chiari malformation (HCC)    Gestational thrombocytopenia (HCC)    Hyperprolactinemia (HCC)    Hypertension    Menstrual migraine     Past Surgical History:  Procedure Laterality Date   NO PAST SURGERIES      Current Outpatient Medications  Medication Sig Dispense Refill   cabergoline  (DOSTINEX ) 0.5 MG tablet Take 0.5 tablets (0.25 mg total) by mouth once a week. 6 tablet 3   cholecalciferol (VITAMIN D3) 25 MCG (1000 UNIT) tablet Take 1,000 Units by mouth daily.     DULoxetine (CYMBALTA) 30 MG capsule Take 30 mg by mouth daily.     No current facility-administered medications for this visit.   Allergies:  Patient has no known allergies.   Social History: The patient  reports that she has never smoked. She has never used smokeless tobacco. She reports that she does not currently use alcohol. She reports that she does not use drugs.   Family History: The patient's family history includes Bladder Cancer in her maternal grandfather; Diabetes in her maternal grandmother and paternal grandmother; Healthy in her father and mother; Heart attack in her paternal grandfather; Hypertension in her maternal grandmother.   ROS:  Please  see the history of present illness. Otherwise, complete review of systems is positive for none  All other systems are reviewed and negative.   Physical Exam: VS:  BP 118/76   Pulse 87   Ht 5' 6 (1.676 m)   Wt 183 lb (83 kg)   SpO2 97%   BMI 29.54 kg/m , BMI Body mass index is 29.54 kg/m.  Wt Readings from Last 3 Encounters:  07/25/23 183 lb (83 kg)  03/13/23 178 lb (80.7 kg)  03/07/22 172 lb 9.6 oz (78.3 kg)    General: Patient appears comfortable at rest. HEENT: Conjunctiva and lids normal, oropharynx clear with moist mucosa. Neck: Supple, no elevated JVP or carotid bruits, no thyromegaly. Lungs: Clear to auscultation, nonlabored breathing at rest. Cardiac: Regular rate and rhythm, no S3 or significant systolic murmur, no pericardial rub. Abdomen: Soft, nontender, no hepatomegaly, bowel sounds present, no guarding or rebound. Extremities: No pitting edema, distal pulses 2+. Skin: Warm and dry. Musculoskeletal: No kyphosis. Neuropsychiatric: Alert and oriented x3, affect grossly appropriate.  Recent Labwork: 03/13/2023: ALT 9; AST 11; BUN 16; Creatinine, Ser 0.78; Potassium 4.9; Sodium 138  No results found for: CHOL, TRIG, HDL, CHOLHDL, VLDL, LDLCALC, LDLDIRECT   Assessment and Plan:  Palpitations: Patient had palpitations consistently for 1 week in September/October 2024 after which she had 1-2 times so far.  Drinks coffee/tea some.  Due to infrequent palpitations after September/October 2024, no indication event monitor for now.  If she  has frequent palpitations in the future, she is instructed to call the clinic for event monitor.  Monitor rhythm with smart watch with EKG lead functionality.       Medication Adjustments/Labs and Tests Ordered: Current medicines are reviewed at length with the patient today.  Concerns regarding medicines are outlined above.    Disposition:  Follow up prn  Signed Maquita Sandoval Arleta Maywood, MD, 07/25/2023 2:09 PM    [facility] [address] [place], [place], [place] [address]

## 2023-07-25 NOTE — Patient Instructions (Addendum)
 Medication Instructions:  Your physician recommends that you continue on your current medications as directed. Please refer to the Current Medication list given to you today.   Labwork: None  Testing/Procedures: None  Follow-Up: Your physician recommends that you schedule a follow-up appointment in: As Needed  Any Other Special Instructions Will Be Listed Below (If Applicable).  Thank you for choosing [facility]!     If you need a refill on your cardiac medications before your next appointment, please call your pharmacy.

## 2024-01-29 ENCOUNTER — Other Ambulatory Visit: Payer: Self-pay | Admitting: Internal Medicine

## 2024-01-30 NOTE — Telephone Encounter (Signed)
 Refill request complete

## 2024-03-13 ENCOUNTER — Encounter: Payer: Self-pay | Admitting: Internal Medicine

## 2024-03-13 ENCOUNTER — Ambulatory Visit: Payer: Commercial Managed Care - PPO | Admitting: Internal Medicine

## 2024-03-13 VITALS — BP 102/70 | HR 85 | Ht 66.0 in | Wt 196.0 lb

## 2024-03-13 DIAGNOSIS — R232 Flushing: Secondary | ICD-10-CM | POA: Diagnosis not present

## 2024-03-13 DIAGNOSIS — E237 Disorder of pituitary gland, unspecified: Secondary | ICD-10-CM | POA: Diagnosis not present

## 2024-03-13 DIAGNOSIS — E221 Hyperprolactinemia: Secondary | ICD-10-CM | POA: Diagnosis not present

## 2024-03-13 NOTE — Progress Notes (Signed)
 Name: Glenda Marsh  MRN/ DOB: [id number], [birth year]    Age/ Sex: 33 y.o., female     PCP: Toribio Jerel MATSU, MD   Reason for Endocrinology Evaluation: Hyperprolactinemia     Initial Endocrinology Clinic Visit: 04/20/2020    PATIENT IDENTIFIER: Glenda Marsh is a 33 y.o., female with a past medical history of arnold chiari malformations. She has followed with [place] [facility] since 04/20/2020  for consultative assistance with management of her hyperprolactinemia.   HISTORICAL SUMMARY:   Glenda Marsh , she was evaluated by [facility] and was started on Cabergoline . Her MRI was unrevealing. (2020) She was on 2 tabs weekly and when her prolactin levels became low , she stopped it. She became pregnant shortly after and is S/P delivery of a baby boy in 11/2019   She was unable to breast feed and 2 months after her last attempt to pump her prolactin level was high at 126 ng/dL through [place] office.     Restarted cabergoline  04/20/2020 , discontinued 07/25/2020 but she was asked to restart in 08/2020 due to elevation of prolactin from 5.6 to 47.7  ng/dL  SUBJECTIVE:    Today (03/13/2024):  Glenda Marsh is here for a follow up on hyperprolactinemia.  Chronic headaches have been improving  Has noted visual changes, pending new glasses  Denies galactorrhea  LMP  2 weeks ago Husband with vasectomy    Denies nausea or vomiting  Has occasional epigastric pain and heartburn  No constipation or diarrhea  Patient did notice hot flashes at night  She is on Vitamin D  2000 international unit daily  Cabergoline  0.5 mg, half a tablet weekly        HISTORY:  Past Medical History:  Past Medical History:  Diagnosis Date   Anxiety    Arnold-Chiari malformation (HCC)    Gestational thrombocytopenia (HCC)    Hyperprolactinemia (HCC)    Hypertension    Menstrual migraine    Past Surgical History:  Past Surgical  History:  Procedure Laterality Date   NO PAST SURGERIES     Social History:  reports that she has never smoked. She has never used smokeless tobacco. She reports that she does not currently use alcohol. She reports that she does not use drugs. Family History:  Family History  Problem Relation Age of Onset   Diabetes Maternal Grandmother    Hypertension Maternal Grandmother    Healthy Mother    Healthy Father    Bladder Cancer Maternal Grandfather    Diabetes Paternal Grandmother    Heart attack Paternal Grandfather      HOME MEDICATIONS: Allergies as of 03/13/2024   No Known Allergies      Medication List        Accurate as of March 13, 2024  3:04 PM. If you have any questions, ask your nurse or doctor.          cabergoline  0.5 MG tablet Commonly known as: DOSTINEX  TAKE 1/2 TABLET BY MOUTH EVERY WEEK   cholecalciferol 25 MCG (1000 UNIT) tablet Commonly known as: VITAMIN D3 Take 1,000 Units by mouth daily.   DULoxetine 30 MG capsule Commonly known as: CYMBALTA Take 30 mg by mouth daily.          OBJECTIVE:   PHYSICAL EXAM: VS: BP 102/70 (BP Location: Left Arm, Patient Position: Sitting, Cuff Size: Normal)   Pulse 85   Ht 5' 6 (1.676 m)   Wt  196 lb (88.9 kg)   SpO2 99%   BMI 31.64 kg/m    Filed Weights   03/13/24 1452  Weight: 196 lb (88.9 kg)      EXAM: General: Glenda appears well and is in NAD  Lungs: Clear with good BS bilat  Heart: Auscultation: RRR.  Abdomen:  soft, nontender  Extremities:  BL LE: No pretibial edema    Mental Status: Judgment, insight: Intact Orientation: Oriented to time, place, and person Mood and affect: No depression, anxiety, or agitation     DATA REVIEWED:   Latest Reference Range & Units 03/13/24 15:28  Sodium 135 - 146 mmol/L 139  Potassium 3.5 - 5.3 mmol/L 4.2  Chloride 98 - 110 mmol/L 104  CO2 20 - 32 mmol/L 28  Glucose 65 - 99 mg/dL 94  BUN 7 - 25 mg/dL 10  Creatinine 9.49 - 9.02 mg/dL 9.26   Calcium 8.6 - 89.7 mg/dL 9.1  BUN/Creatinine Ratio 6 - 22 (calc) SEE NOTE:  eGFR > OR = 60 mL/min/1.47m2 111  AG Ratio 1.0 - 2.5 (calc) 1.9  AST 10 - 30 U/L 14  ALT 6 - 29 U/L 12  Total Protein 6.1 - 8.1 g/dL 6.7  Total Bilirubin 0.2 - 1.2 mg/dL 0.3  Alkaline phosphatase (APISO) 31 - 125 U/L 58  Globulin 1.9 - 3.7 g/dL (calc) 2.3  WBC 3.8 - 89.1 Thousand/uL 5.1  RBC 3.80 - 5.10 Million/uL 4.56  Hemoglobin 11.7 - 15.5 g/dL 87.2  HCT 64.9 - 54.9 % 39.4  MCV 80.0 - 100.0 fL 86.4  MCH 27.0 - 33.0 pg 27.9  MCHC 32.0 - 36.0 g/dL 67.7  RDW 88.9 - 84.9 % 12.8  Platelets 140 - 400 Thousand/uL 174  MPV 7.5 - 12.5 fL 11.3  C206 ACTH  6 - 50 pg/mL 17  FSH mIU/mL 15.7  Prolactin ng/mL 1.9 (L)  Estradiol  pg/mL 86  TSH mIU/L 1.45  T4,Free(Direct) 0.8 - 1.8 ng/dL 1.0       MRI brain 0/12/7975  Brain: Stable appearance of Chiari malformation. Cerebellar tonsils are pointed and extend at least 2 cm below the carotid foramen magnum. Ventricles are of normal size. No significant white matter lesions are present. No acute infarct, hemorrhage, or mass lesion is present. The ventricles are of normal size.   Dedicated imaging of the pituitary demonstrates slight asymmetry of the gland on the left, potentially within normal limits. Pituitary stalk is midline. Optic chiasm within normal limits. Cavernous sinus is normal bilaterally.   Vascular: Flow is present in the major intracranial arteries.   Skull and upper cervical spine: Borderline platybasia is present without definite basilar invagination. There is kinking of the brainstem over the dens.   Sinuses/Orbits: The globes and orbits are within normal limits. The paranasal sinuses and mastoid air cells are clear.   IMPRESSION: 1. Stable appearance of Chiari malformation extending over 2 cm through a crowded foramen magnum. 2. Borderline platybasia. The dens results in some kinking of the brainstem/upper cervical spine 3. Normal MRI  appearance of the brain.  ASSESSMENT / PLAN / RECOMMENDATIONS:   Hyperprolactinemia :   - We attempted to stop cabergoline  in 07/2020 with normalization of prolactin level at the time, 6 weeks later her prolactin level increase to 47 and she was advised to restart Cabergoline  - MRI 03/2021 was non revealing except for left pituitary asymmetry which is though to be normal  - Prolactin level is low, will discontinue half a tablet a week of cabergoline  and recheck  in 2 months Medications   Stop cabergoline  0.5 mg , half a tablet a week    2.  Weight gain :  - Counseled about diet and exercise - ACTH , cortisol, and CMP normal - TFTs, estradiol , FSH are normal    F/U in 1 yr   Signed electronically by: Stefano Redgie Butts, MD  [facility] Endocrinology  [facility] [address] [place], [place] [address] Phone: [phone] FAX: [phone]      CC: Toribio Jerel MATSU, MD [address] [place] [address] Phone: [phone]  Fax: [phone]   Return to Endocrinology clinic as below: No future appointments.

## 2024-03-13 NOTE — Patient Instructions (Signed)
 24

## 2024-03-17 ENCOUNTER — Ambulatory Visit: Payer: Self-pay | Admitting: Internal Medicine

## 2024-03-17 NOTE — Telephone Encounter (Signed)
 Please schedule the patient for a lab appointment in 2 months   A portal message has been sent to the patient  Thanks

## 2024-03-18 LAB — COMPREHENSIVE METABOLIC PANEL WITH GFR
AG Ratio: 1.9 (calc) (ref 1.0–2.5)
ALT: 12 U/L (ref 6–29)
AST: 14 U/L (ref 10–30)
Albumin: 4.4 g/dL (ref 3.6–5.1)
Alkaline phosphatase (APISO): 58 U/L (ref 31–125)
BUN: 10 mg/dL (ref 7–25)
CO2: 28 mmol/L (ref 20–32)
Calcium: 9.1 mg/dL (ref 8.6–10.2)
Chloride: 104 mmol/L (ref 98–110)
Creat: 0.73 mg/dL (ref 0.50–0.97)
Globulin: 2.3 g/dL (ref 1.9–3.7)
Glucose, Bld: 94 mg/dL (ref 65–99)
Potassium: 4.2 mmol/L (ref 3.5–5.3)
Sodium: 139 mmol/L (ref 135–146)
Total Bilirubin: 0.3 mg/dL (ref 0.2–1.2)
Total Protein: 6.7 g/dL (ref 6.1–8.1)
eGFR: 111 mL/min/1.73m2 (ref >=60)

## 2024-03-18 LAB — CBC
HCT: 39.4 % (ref 35.0–45.0)
Hemoglobin: 12.7 g/dL (ref 11.7–15.5)
MCH: 27.9 pg (ref 27.0–33.0)
MCHC: 32.2 g/dL (ref 32.0–36.0)
MCV: 86.4 fL (ref 80.0–100.0)
MPV: 11.3 fL (ref 7.5–12.5)
Platelets: 174 Thousand/uL (ref 140–400)
RBC: 4.56 Million/uL (ref 3.80–5.10)
RDW: 12.8 % (ref 11.0–15.0)
WBC: 5.1 Thousand/uL (ref 3.8–10.8)

## 2024-03-18 LAB — T4, FREE: Free T4: 1 ng/dL (ref 0.8–1.8)

## 2024-03-18 LAB — ESTRADIOL: Estradiol: 86 pg/mL

## 2024-03-18 LAB — INSULIN-LIKE GROWTH FACTOR
IGF-I, LC/MS: 147 ng/mL (ref 53–331)
Z-Score (Female): 0 {STDV} (ref ?–2.0)

## 2024-03-18 LAB — PROLACTIN: Prolactin: 1.9 ng/mL — ABNORMAL LOW

## 2024-03-18 LAB — FOLLICLE STIMULATING HORMONE: FSH: 15.7 m[IU]/mL

## 2024-03-18 LAB — CORTISOL: Cortisol, Plasma: 9.1 ug/dL

## 2024-03-18 LAB — TSH: TSH: 1.45 m[IU]/L

## 2024-03-18 LAB — ACTH: C206 ACTH: 17 pg/mL (ref 6–50)

## 2024-04-14 ENCOUNTER — Telehealth: Payer: Self-pay | Admitting: Internal Medicine

## 2024-04-14 NOTE — Telephone Encounter (Signed)
 Was on the phone with patient this morning rescheduling an appointment,she wanted to know if she needed to have her levels checked since her last appointment on 03/13/24.Please advise

## 2024-04-16 ENCOUNTER — Telehealth: Payer: Self-pay | Admitting: Internal Medicine

## 2024-05-11 ENCOUNTER — Other Ambulatory Visit

## 2024-05-14 ENCOUNTER — Other Ambulatory Visit

## 2024-05-15 LAB — PROLACTIN: Prolactin: 39.3 ng/mL — ABNORMAL HIGH

## 2024-05-15 MED ORDER — CABERGOLINE 0.5 MG PO TABS: 0.2500 mg | ORAL_TABLET | ORAL | 3 refills | Status: AC

## 2024-05-15 NOTE — Addendum Note (Signed)
 Addended by: SAM DONELL PARAS on: 05/15/2024 10:09 AM   Modules accepted: Orders

## 2024-06-07 ENCOUNTER — Encounter: Payer: Self-pay | Admitting: Internal Medicine

## 2024-06-23 ENCOUNTER — Encounter: Payer: Self-pay | Admitting: Internal Medicine

## 2024-07-23 ENCOUNTER — Encounter (HOSPITAL_COMMUNITY): Payer: Self-pay

## 2024-07-23 ENCOUNTER — Observation Stay (HOSPITAL_COMMUNITY)
Admission: EM | Admit: 2024-07-23 | Discharge: 2024-07-24 | Disposition: A | Discharge status: Home / Self Care | Source: Ambulatory Visit | Attending: General Surgery | Admitting: General Surgery

## 2024-07-23 ENCOUNTER — Emergency Department (HOSPITAL_COMMUNITY)

## 2024-07-23 ENCOUNTER — Other Ambulatory Visit: Payer: Self-pay

## 2024-07-23 DIAGNOSIS — E66811 Obesity, class 1: Secondary | ICD-10-CM | POA: Diagnosis not present

## 2024-07-23 DIAGNOSIS — K3580 Unspecified acute appendicitis: Principal | ICD-10-CM | POA: Diagnosis present

## 2024-07-23 DIAGNOSIS — K352 Acute appendicitis with generalized peritonitis, without perforation or abscess: Secondary | ICD-10-CM | POA: Diagnosis not present

## 2024-07-23 DIAGNOSIS — I10 Essential (primary) hypertension: Secondary | ICD-10-CM | POA: Insufficient documentation

## 2024-07-23 DIAGNOSIS — R109 Unspecified abdominal pain: Secondary | ICD-10-CM | POA: Diagnosis present

## 2024-07-23 DIAGNOSIS — F419 Anxiety disorder, unspecified: Secondary | ICD-10-CM | POA: Insufficient documentation

## 2024-07-23 DIAGNOSIS — Z6830 Body mass index (BMI) 30.0-30.9, adult: Secondary | ICD-10-CM | POA: Diagnosis not present

## 2024-07-23 LAB — URINALYSIS, ROUTINE W REFLEX MICROSCOPIC
Bilirubin Urine: NEGATIVE
Glucose, UA: NEGATIVE mg/dL
Hgb urine dipstick: NEGATIVE
Ketones, ur: NEGATIVE mg/dL
Nitrite: NEGATIVE
Protein, ur: NEGATIVE mg/dL
Specific Gravity, Urine: 1.018 (ref 1.005–1.030)
pH: 5 (ref 5.0–8.0)

## 2024-07-23 LAB — COMPREHENSIVE METABOLIC PANEL WITH GFR
ALT: 12 U/L (ref 0–44)
AST: 14 U/L — ABNORMAL LOW (ref 15–41)
Albumin: 4.6 g/dL (ref 3.5–5.0)
Alkaline Phosphatase: 62 U/L (ref 38–126)
Anion gap: 9 (ref 5–15)
BUN: 12 mg/dL (ref 6–20)
CO2: 27 mmol/L (ref 22–32)
Calcium: 9.5 mg/dL (ref 8.9–10.3)
Chloride: 103 mmol/L (ref 98–111)
Creatinine, Ser: 0.75 mg/dL (ref 0.44–1.00)
GFR, Estimated: >60 mL/min
Glucose, Bld: 82 mg/dL (ref 70–99)
Potassium: 4.5 mmol/L (ref 3.5–5.1)
Sodium: 139 mmol/L (ref 135–145)
Total Bilirubin: 0.3 mg/dL (ref 0.0–1.2)
Total Protein: 7.2 g/dL (ref 6.5–8.1)

## 2024-07-23 LAB — LIPASE, BLOOD: Lipase: 21 U/L (ref 11–51)

## 2024-07-23 LAB — CBC
HCT: 41.4 % (ref 36.0–46.0)
Hemoglobin: 13.6 g/dL (ref 12.0–15.0)
MCH: 28 pg (ref 26.0–34.0)
MCHC: 32.9 g/dL (ref 30.0–36.0)
MCV: 85.4 fL (ref 80.0–100.0)
Platelets: 189 K/uL (ref 150–400)
RBC: 4.85 MIL/uL (ref 3.87–5.11)
RDW: 12.9 % (ref 11.5–15.5)
WBC: 9.2 K/uL (ref 4.0–10.5)
nRBC: 0 % (ref 0.0–0.2)

## 2024-07-23 LAB — POC URINE PREG, ED: Preg Test, Ur: NEGATIVE

## 2024-07-23 LAB — HCG, QUANTITATIVE, PREGNANCY: hCG, Beta Chain, Quant, S: <1 m[IU]/mL

## 2024-07-23 MED ORDER — ACETAMINOPHEN 325 MG PO TABS: 650.0000 mg | ORAL_TABLET | Freq: Four times a day (QID) | ORAL | Status: DC | PRN

## 2024-07-23 MED ORDER — SODIUM CHLORIDE 0.9 % IV SOLN
2.0000 g | Freq: Once | INTRAVENOUS | Status: AC
  Administered 2024-07-23: 2 g via INTRAVENOUS
  Filled 2024-07-23: qty 20

## 2024-07-23 MED ORDER — IOHEXOL 300 MG/ML  SOLN
100.0000 mL | Freq: Once | INTRAMUSCULAR | Status: AC | PRN
  Administered 2024-07-23: 100 mL via INTRAVENOUS

## 2024-07-23 MED ORDER — ONDANSETRON HCL 4 MG PO TABS: 4.0000 mg | ORAL_TABLET | Freq: Four times a day (QID) | ORAL | Status: DC | PRN

## 2024-07-23 MED ORDER — ONDANSETRON HCL 4 MG/2ML IJ SOLN: 4.0000 mg | Freq: Four times a day (QID) | INTRAMUSCULAR | Status: DC | PRN

## 2024-07-23 MED ORDER — ACETAMINOPHEN 650 MG RE SUPP: 650.0000 mg | Freq: Four times a day (QID) | RECTAL | Status: DC | PRN

## 2024-07-23 NOTE — H&P (Addendum)
 " History and Physical    Patient: Glenda Marsh FMW:[id number] DOB: 1990-07-27 DOA: 07/23/2024 DOS: the patient was seen and examined on 07/23/2024 PCP: Toribio Jerel MATSU, MD  Patient coming from: Home  Chief Complaint:  Chief Complaint  Patient presents with   Abdominal Pain   HPI: Glenda Marsh is a 34 y.o. female with medical history significant of anxiety who presents to the emergency department due to 1 day onset of abdominal pain which started in the umbilical area and has since migrated to right lower abdominal area.  She went to see her PCP who suspected that she may be having appendicitis, so she was asked to go to the ED for further evaluation.  Abdominal pain was associated with nausea, but she denied vomiting, fever, headache.  ED course In the emergency department, she was hemodynamically stable.  CBC and BMP was normal.  Urinalysis was normal. CT abdomen pelvis with contrast was suggestive of possible mild early acute appendicitis. She was treated with ceftriaxone .  General surgery (Dr. Kallie) was consulted and recommended admitting the patient with plan to take patient to the OR in the morning. TR[facility] was asked to admit patient.    Review of Systems: As mentioned in the history of present illness. All other systems reviewed and are negative. Past Medical History:  Diagnosis Date   Anxiety    Arnold-Chiari malformation (HCC)    Gestational thrombocytopenia    Hyperprolactinemia    Hypertension    Menstrual migraine    Past Surgical History:  Procedure Laterality Date   NO PAST SURGERIES     Social History:  reports that she has never smoked. She has never used smokeless tobacco. She reports that she does not currently use alcohol. She reports that she does not use drugs.  Allergies[1]  Family History  Problem Relation Age of Onset   Diabetes Maternal Grandmother    Hypertension Maternal Grandmother    Healthy Mother    Healthy Father    Bladder Cancer Maternal  Grandfather    Diabetes Paternal Grandmother    Heart attack Paternal Grandfather     Prior to Admission medications  Medication Sig Start Date End Date Taking? Authorizing Provider  cabergoline  (DOSTINEX ) 0.5 MG tablet Take 0.5 tablets (0.25 mg total) by mouth 2 (two) times a week. 05/18/24   Shamleffer, Ibtehal Jaralla, MD  cholecalciferol (VITAMIN D3) 25 MCG (1000 UNIT) tablet Take 1,000 Units by mouth daily.    [provider]  DULoxetine  (CYMBALTA ) 30 MG capsule Take 30 mg by mouth daily. 03/08/23   [provider]    Physical Exam: Vitals:   07/23/24 1817 07/23/24 1818 07/23/24 2235  BP: 135/77  127/72  Pulse: 93  86  Resp: 17  16  Temp: 99.2 F (37.3 C)  99.4 F (37.4 C)  TempSrc: Oral  Oral  SpO2: 99%  100%  Weight:  89.4 kg   Height:  5' 7 (1.702 m)    General: Awake and alert and oriented x3. Not in any acute distress.  HEENT: NCAT.  PERRLA. EOMI. Sclerae anicteric.  Moist mucosal membranes. Neck: Neck supple without lymphadenopathy. No carotid bruits. No masses palpated.  Cardiovascular: Regular rate with normal S1-S2 sounds. No murmurs, rubs or gallops auscultated. No JVD.  Respiratory: Clear breath sounds.  No accessory muscle use. Abdomen: Soft, tender to palpation of RLQ without guarding, nondistended. Active bowel sounds. Skin: No rashes, lesions, or ulcerations.  Dry, warm to touch. Musculoskeletal:  2+ dorsalis pedis  and radial pulses. Good ROM.  No contractures  Psychiatric: Intact judgment and insight.  Mood appropriate to current condition. Neurologic: No focal neurological deficits. Strength is 5/5 x 4.  CN II - XII grossly intact.  Assessment and Plan: Acute appendicitis Patient was treated with IV ceftriaxone , we will continue with IV Zosyn  Continue IV Dilaudid  0.5 mg every 4 hours as needed Continue Zofran  as needed for nausea/vomiting Continue IV hydration General surgery (Dr. Kallie was already consulted and will see patient in  the morning per  EDP  Anxiety Consider starting home meds after surgery  Obesity class I (BMI 30.85) Diet and lifestyle modification   Advance Care Planning: Full code  Consults: General Surgery  Family Communication: Husband at bedside  Severity of Illness: The appropriate patient status for this patient is INPATIENT. Inpatient status is judged to be reasonable and necessary in order to provide the required intensity of service to ensure the patient's safety. The patient's presenting symptoms, physical exam findings, and initial radiographic and laboratory data in the context of their chronic comorbidities is felt to place them at high risk for further clinical deterioration. Furthermore, it is not anticipated that the patient will be medically stable for discharge from the hospital within 2 midnights of admission.   * I certify that at the point of admission it is my clinical judgment that the patient will require inpatient hospital care spanning beyond 2 midnights from the point of admission due to high intensity of service, high risk for further deterioration and high frequency of surveillance required.*  Author: Vrishank Moster, DO 07/23/2024 11:36 PM  For on call review [email or web address].      [1] No Known Allergies  "

## 2024-07-23 NOTE — ED Triage Notes (Signed)
 Pt reports RLQ abd pain since last night. Sent from PCP to eval for appendicitis.

## 2024-07-23 NOTE — ED Provider Notes (Signed)
 " [place] [facility] AT [facility] Provider Note   CSN: [id number] Arrival date & time: 07/23/24  1730     Patient presents with: Abdominal Pain   Glenda Marsh is a 34 y.o. female.  {Add pertinent medical, surgical, social history, OB history to YEP:67052} Patient complains of lower abdominal pain for 24 hours.  No vomiting no diarrhea no fever  The history is provided by the patient.  Abdominal Pain Pain location:  Generalized Pain quality: aching   Pain radiates to:  Does not radiate Pain severity:  Moderate Onset quality:  Sudden Timing:  Constant Progression:  Worsening Associated symptoms: no chest pain, no cough, no diarrhea, no fatigue and no hematuria        Prior to Admission medications  Medication Sig Start Date End Date Taking? Authorizing Provider  cabergoline  (DOSTINEX ) 0.5 MG tablet Take 0.5 tablets (0.25 mg total) by mouth 2 (two) times a week. 05/18/24   Shamleffer, Ibtehal Jaralla, MD  cholecalciferol (VITAMIN D3) 25 MCG (1000 UNIT) tablet Take 1,000 Units by mouth daily.    [provider]  DULoxetine  (CYMBALTA ) 30 MG capsule Take 30 mg by mouth daily. 03/08/23   [provider]    Allergies: Patient has no known allergies.    Review of Systems  Constitutional:  Negative for appetite change and fatigue.  HENT:  Negative for congestion, ear discharge and sinus pressure.   Eyes:  Negative for discharge.  Respiratory:  Negative for cough.   Cardiovascular:  Negative for chest pain.  Gastrointestinal:  Positive for abdominal pain. Negative for diarrhea.  Genitourinary:  Negative for frequency and hematuria.  Musculoskeletal:  Negative for back pain.  Skin:  Negative for rash.  Neurological:  Negative for seizures and headaches.  Psychiatric/Behavioral:  Negative for hallucinations.     Updated Vital Signs BP 127/72   Pulse 86   Temp 99.4 F (37.4 C) (Oral)   Resp 16   Ht 5' 7 (1.702 m)   Wt 89.4 kg    LMP 07/09/2024 (Approximate)   SpO2 100%   BMI 30.85 kg/m   Physical Exam Vitals and nursing note reviewed.  Constitutional:      Appearance: She is well-developed.  HENT:     Head: Normocephalic.     Mouth/Throat:     Mouth: Mucous membranes are moist.  Eyes:     General: No scleral icterus.    Conjunctiva/sclera: Conjunctivae normal.  Neck:     Thyroid : No thyromegaly.  Cardiovascular:     Rate and Rhythm: Normal rate and regular rhythm.     Heart sounds: No murmur heard.    No friction rub. No gallop.  Pulmonary:     Breath sounds: No stridor. No wheezing or rales.  Chest:     Chest wall: No tenderness.  Abdominal:     General: There is no distension.     Tenderness: There is abdominal tenderness. There is no rebound.  Musculoskeletal:        General: Normal range of motion.     Cervical back: Neck supple.  Lymphadenopathy:     Cervical: No cervical adenopathy.  Skin:    Findings: No erythema or rash.  Neurological:     Mental Status: She is alert and oriented to person, place, and time.     Motor: No abnormal muscle tone.     Coordination: Coordination normal.  Psychiatric:        Behavior: Behavior normal.     (  all labs ordered are listed, but only abnormal results are displayed) Labs Reviewed  COMPREHENSIVE METABOLIC PANEL WITH GFR - Abnormal; Notable for the following components:      Result Value   AST 14 (*)    All other components within normal limits  URINALYSIS, ROUTINE W REFLEX MICROSCOPIC - Abnormal; Notable for the following components:   APPearance HAZY (*)    Leukocytes,Ua MODERATE (*)    Bacteria, UA FEW (*)    All other components within normal limits  LIPASE, BLOOD  CBC  HCG, QUANTITATIVE, PREGNANCY  POC URINE PREG, ED    EKG: None  Radiology: No results found.  {Document cardiac monitor, telemetry assessment procedure when appropriate:32947} Procedures   Medications Ordered in the ED  iohexol  (OMNIPAQUE ) 300 MG/ML solution  100 mL (100 mLs Intravenous Contrast Given 07/23/24 2251)      {Click here for ABCD2, HEART and other calculators REFRESH Note before signing:1}                              Medical Decision Making Amount and/or Complexity of Data Reviewed Labs: ordered. Radiology: ordered.  Risk Prescription drug management. Decision regarding hospitalization.   Abdominal pain, CT scan shows appendicitis.  Patient will be admitted to medicine with surgery consult tomorrow.  She needs to be n.p.o.  {Document critical care time when appropriate  Document review of labs and clinical decision tools ie CHADS2VASC2, etc  Document your independent review of radiology images and any outside records  Document your discussion with family members, caretakers and with consultants  Document social determinants of health affecting pt's care  Document your decision making why or why not admission, treatments were needed:32947:::1}   Final diagnoses:  None    ED Discharge Orders     None        "

## 2024-07-24 ENCOUNTER — Observation Stay (HOSPITAL_COMMUNITY): Admitting: Anesthesiology

## 2024-07-24 ENCOUNTER — Encounter (HOSPITAL_COMMUNITY): Payer: Self-pay | Admitting: Internal Medicine

## 2024-07-24 ENCOUNTER — Encounter (HOSPITAL_COMMUNITY)
Admission: EM | Disposition: A | Discharge status: Home / Self Care | Payer: Self-pay | Source: Ambulatory Visit | Attending: Emergency Medicine

## 2024-07-24 DIAGNOSIS — K353 Acute appendicitis with localized peritonitis, without perforation or gangrene: Secondary | ICD-10-CM | POA: Diagnosis not present

## 2024-07-24 DIAGNOSIS — K3580 Unspecified acute appendicitis: Principal | ICD-10-CM

## 2024-07-24 HISTORY — PX: XI ROBOTIC LAPAROSCOPIC ASSISTED APPENDECTOMY: SHX6877

## 2024-07-24 LAB — CBC
HCT: 37.5 % (ref 36.0–46.0)
Hemoglobin: 12.5 g/dL (ref 12.0–15.0)
MCH: 28 pg (ref 26.0–34.0)
MCHC: 33.3 g/dL (ref 30.0–36.0)
MCV: 84.1 fL (ref 80.0–100.0)
Platelets: 141 K/uL — ABNORMAL LOW (ref 150–400)
RBC: 4.46 MIL/uL (ref 3.87–5.11)
RDW: 13 % (ref 11.5–15.5)
WBC: 6.1 K/uL (ref 4.0–10.5)
nRBC: 0 % (ref 0.0–0.2)

## 2024-07-24 LAB — PHOSPHORUS: Phosphorus: 3.4 mg/dL (ref 2.5–4.6)

## 2024-07-24 LAB — COMPREHENSIVE METABOLIC PANEL WITH GFR
ALT: 10 U/L (ref 0–44)
AST: 12 U/L — ABNORMAL LOW (ref 15–41)
Albumin: 4.3 g/dL (ref 3.5–5.0)
Alkaline Phosphatase: 58 U/L (ref 38–126)
Anion gap: 9 (ref 5–15)
BUN: 9 mg/dL (ref 6–20)
CO2: 26 mmol/L (ref 22–32)
Calcium: 8.9 mg/dL (ref 8.9–10.3)
Chloride: 105 mmol/L (ref 98–111)
Creatinine, Ser: 0.65 mg/dL (ref 0.44–1.00)
GFR, Estimated: >60 mL/min
Glucose, Bld: 96 mg/dL (ref 70–99)
Potassium: 3.9 mmol/L (ref 3.5–5.1)
Sodium: 139 mmol/L (ref 135–145)
Total Bilirubin: 0.2 mg/dL (ref 0.0–1.2)
Total Protein: 6.7 g/dL (ref 6.5–8.1)

## 2024-07-24 LAB — HIV ANTIBODY (ROUTINE TESTING W REFLEX): HIV Screen 4th Generation wRfx: NONREACTIVE

## 2024-07-24 LAB — MAGNESIUM: Magnesium: 2.3 mg/dL (ref 1.7–2.4)

## 2024-07-24 MED ORDER — LACTATED RINGERS IV SOLN
INTRAVENOUS | Status: AC
  Administered 2024-07-24: 1000 mL via INTRAVENOUS

## 2024-07-24 MED ORDER — BUPIVACAINE HCL (PF) 0.5 % IJ SOLN
INTRAMUSCULAR | Status: AC
  Filled 2024-07-24: qty 30

## 2024-07-24 MED ORDER — OXYCODONE HCL 5 MG PO TABS: 5.0000 mg | ORAL_TABLET | Freq: Once | ORAL | Status: DC | PRN

## 2024-07-24 MED ORDER — MIDAZOLAM HCL 2 MG/2ML IJ SOLN
INTRAMUSCULAR | Status: AC
  Filled 2024-07-24: qty 2

## 2024-07-24 MED ORDER — LACTATED RINGERS IV SOLN: INTRAVENOUS | Status: DC

## 2024-07-24 MED ORDER — STERILE WATER FOR IRRIGATION IR SOLN
Status: DC | PRN
  Administered 2024-07-24: 1000 mL

## 2024-07-24 MED ORDER — ACETAMINOPHEN 500 MG PO TABS
ORAL_TABLET | ORAL | Status: AC
  Filled 2024-07-24: qty 1

## 2024-07-24 MED ORDER — PIPERACILLIN-TAZOBACTAM 3.375 G IVPB
3.3750 g | Freq: Three times a day (TID) | INTRAVENOUS | Status: DC
  Administered 2024-07-24 (×2): 3.375 g via INTRAVENOUS
  Filled 2024-07-24 (×2): qty 50

## 2024-07-24 MED ORDER — FENTANYL CITRATE (PF) 250 MCG/5ML IJ SOLN
INTRAMUSCULAR | Status: AC
  Filled 2024-07-24: qty 5

## 2024-07-24 MED ORDER — BUPIVACAINE HCL (PF) 0.5 % IJ SOLN
INTRAMUSCULAR | Status: DC | PRN
  Administered 2024-07-24: 30 mL

## 2024-07-24 MED ORDER — FENTANYL CITRATE (PF) 50 MCG/ML IJ SOSY: 25.0000 ug | PREFILLED_SYRINGE | INTRAMUSCULAR | Status: DC | PRN

## 2024-07-24 MED ORDER — OXYCODONE HCL 5 MG PO TABS: 5.0000 mg | ORAL_TABLET | ORAL | 0 refills | Status: AC | PRN

## 2024-07-24 MED ORDER — CHLORHEXIDINE GLUCONATE CLOTH 2 % EX PADS
6.0000 | MEDICATED_PAD | Freq: Once | CUTANEOUS | Status: AC
  Administered 2024-07-24: 6 via TOPICAL

## 2024-07-24 MED ORDER — SUGAMMADEX SODIUM 200 MG/2ML IV SOLN
INTRAVENOUS | Status: DC | PRN
  Administered 2024-07-24: 200 mg via INTRAVENOUS

## 2024-07-24 MED ORDER — SODIUM CHLORIDE 0.9 % IV SOLN
2.0000 g | INTRAVENOUS | Status: AC
  Administered 2024-07-24: 2 g via INTRAVENOUS
  Filled 2024-07-24: qty 2

## 2024-07-24 MED ORDER — CHLORHEXIDINE GLUCONATE 0.12 % MT SOLN
15.0000 mL | Freq: Once | OROMUCOSAL | Status: AC
  Administered 2024-07-24: 15 mL via OROMUCOSAL

## 2024-07-24 MED ORDER — DULOXETINE HCL 30 MG PO CPEP: 30.0000 mg | ORAL_CAPSULE | Freq: Every day | ORAL | Status: DC

## 2024-07-24 MED ORDER — OXYCODONE HCL 5 MG/5ML PO SOLN: 5.0000 mg | Freq: Once | ORAL | Status: DC | PRN

## 2024-07-24 MED ORDER — ACETAMINOPHEN 500 MG PO TABS
1000.0000 mg | ORAL_TABLET | Freq: Once | ORAL | Status: AC
  Administered 2024-07-24: 1000 mg via ORAL

## 2024-07-24 MED ORDER — FENTANYL CITRATE (PF) 100 MCG/2ML IJ SOLN
INTRAMUSCULAR | Status: DC | PRN
  Administered 2024-07-24: 100 ug via INTRAVENOUS
  Administered 2024-07-24: 50 ug via INTRAVENOUS

## 2024-07-24 MED ORDER — HYDROMORPHONE HCL 1 MG/ML IJ SOLN: 0.5000 mg | INTRAMUSCULAR | Status: DC | PRN

## 2024-07-24 MED ORDER — ROCURONIUM BROMIDE 10 MG/ML (PF) SYRINGE
PREFILLED_SYRINGE | INTRAVENOUS | Status: DC | PRN
  Administered 2024-07-24: 60 mg via INTRAVENOUS

## 2024-07-24 MED ORDER — PROPOFOL 10 MG/ML IV BOLUS
INTRAVENOUS | Status: DC | PRN
  Administered 2024-07-24: 130 mg via INTRAVENOUS

## 2024-07-24 MED ORDER — MIDAZOLAM HCL (PF) 2 MG/2ML IJ SOLN
INTRAMUSCULAR | Status: DC | PRN
  Administered 2024-07-24: 2 mg via INTRAVENOUS

## 2024-07-24 MED ORDER — DEXAMETHASONE SOD PHOSPHATE PF 10 MG/ML IJ SOLN
INTRAMUSCULAR | Status: DC | PRN
  Administered 2024-07-24: 8 mg via INTRAVENOUS

## 2024-07-24 MED ORDER — LIDOCAINE HCL (CARDIAC) PF 100 MG/5ML IV SOSY
PREFILLED_SYRINGE | INTRAVENOUS | Status: DC | PRN
  Administered 2024-07-24: 100 mg via INTRATRACHEAL

## 2024-07-24 MED ORDER — ONDANSETRON HCL 4 MG PO TABS: 4.0000 mg | ORAL_TABLET | Freq: Three times a day (TID) | ORAL | 1 refills | Status: AC | PRN

## 2024-07-24 MED ORDER — ONDANSETRON HCL 4 MG/2ML IJ SOLN
INTRAMUSCULAR | Status: DC | PRN
  Administered 2024-07-24: 4 mg via INTRAVENOUS

## 2024-07-24 MED ORDER — PROPOFOL 10 MG/ML IV BOLUS
INTRAVENOUS | Status: AC
  Filled 2024-07-24: qty 20

## 2024-07-24 MED ORDER — ONDANSETRON HCL 4 MG/2ML IJ SOLN: 4.0000 mg | Freq: Once | INTRAMUSCULAR | Status: DC | PRN

## 2024-07-24 NOTE — Op Note (Signed)
 [facility]  07/24/2024  Preoperative diagnosis: Acute appendicitis  Postoperative diagnosis: Same  Procedure: Robotic assisted laparoscopic appendectomy  Anesthesia: General   Surgeon: Manuelita Pander, MD   Wound Classification: Contaminated  Specimen: Appendix  Complications: None  Estimated Blood Loss: Minimal   Indications: Patient is a 34 y.o. female  presented with acute appendicitis on CT. The risk of surgery were explained to the patient including but not limited to bleeding, infection, finding a rupture, injury to other organs, needing to do an open procedure.   FIndings: Dilated and inflamed appendix Upon entering the abdomen (organ space), I encountered infection of the appendix.   Description of procedure: The patient was placed on the operating table in the supine position, left arm tucked. General anesthesia was induced. A time-out was completed verifying correct patient, procedure, site, positioning, and implant(s) and/or special equipment prior to beginning this procedure. A foley and OG were placed. The abdomen was prepped and draped in the usual sterile fashion.   In Palmer's point an incision was made and Veress needle was inserted.  I could not get the saline to drop, so I opted for a stab incision in the Supraumbilical region and elevated the umbilicus. After confirming intraabdominal location with positive saline drop test and low insufflation pressures, gas insufflation was initiated until the abdominal pressure was measured at 15 mmHg.  An 8 mm port was placed through the mid left abdomen under direct visualization using the Optiview technique. There were no injuries from the Veress which was removed and no sign of peritoneal violation at Palmer's point. Two additional incision was made 4-8 cm apart each side along the left side of the abdominal wall from the initial incision.  An 8 mm port in the left lower abdomen and the left upper abdomen,  both were placed under direct visualization.  No injuries from trocar placements were noted. The table was placed in the Trendelenburg position with the right side elevated.  The robotic platform was then brought to the operative field and docked. A vessel sealer was placed through the upper left abdomen 8 mm port and a forced bipolar through the lower 8 mm port.   Upon entering the abdomen (organ space), I encountered infection of the appendix. An appendix that appeared dilated and inflamed was identified and elevated.  Window created at base of appendix in the mesentery.  A white load linear cutting stapler was placed through the upper left 8 mm port, and then used to divide and staple the base of the appendix. The vessel sealer was used to ligate the mesoappendix. No bleeding from the staple lines noted.  The appendix was placed in an endoscopic retrieval bag and removed through the 8 mm port.   The appendiceal staple line and mesoappendix ligation was examined again and hemostasis noted. No other pathology was identified within pelvis. . Remaining trocars were removed. No bleeding was noted.The abdomen was allowed to collapse. Marcaine  was injected. All skin incisions then closed with subcuticular sutures Monocryl 4-0.  Wounds then dressed with dermabond.  The patient tolerated the procedure well, awakened from anesthesia and was taken to the postanesthesia care unit in satisfactory condition.  The foley and OG were removed. Sponge count and instrument count correct at the end of the procedure.  Manuelita Pander, MD [facility] [address] [place], [place] [address] [phone] (office)

## 2024-07-24 NOTE — ED Notes (Signed)
 Pt reminded to stay NPO and she verbalized she has not had anything since before midnight. Pt notified if she is to go to surgery today all jewelry would need to come off and it was better to give to her husband for safe keeping.

## 2024-07-24 NOTE — Consult Note (Addendum)
 I was present with the medical student for this service. I personally verified the history of present illness, performed the physical exam, and made the plan for this encounter. I have verified the medical student's documentation and made modifications where appropriately. I have personally documented in my own words a brief history, physical, and plan below.   Tender RLQ.  Reviewed CT scan and showed patient. Dilated at the base of the appendix. Discussed the risk of laparoscopic appendectomy and the option of antibiotics alone. Discussed that in [place] and some trials in the [place] , antibiotics are used for simple appendicitis. Discussed that research shows a 40% failure rate for antibiotics alone.  Discussed risk of surgery including but not limited to bleeding, infection, injury to other organs, normal appendix, and after this discussion the patient has decided to proceed with surgery.    Discussed negative pathology.  Discussed likely home after surgery.  Manuelita Pander, MD [facility] [address] [place], [place] [address] [phone] (office)    [facility] Consult  Reason for Consult: Acute appendicitis Referring Physician: Dr. Posey Maier  Chief Complaint   Abdominal Pain     HPI: Glenda Marsh is a 34 y.o. female with HTN and anxiety who presented to the ED from her PCP's office for 1d abdominal pain.  The pain started Wednesday (07/22/24) evening with a feeling like she was coming down with something and interrupting sleep. It progressed to the point of her visiting her PCP the following day, who confirmed RLQ TTP and recommended ED evaluation. It is associated with nausea and chills.  The pain is improved with lying still, and is made worse with movement. She denies having had a BM since onset of pain. She denies vomiting, fevers, changes to BM, and changes to urination.  ED Course: HDS. CBC, BMP, UA normal. CT  abd/pelvis with contrast suggestive of possible mild early acute appendicitis. Received ceftriaxone .  Past Medical History:  Diagnosis Date   Anxiety    Arnold-Chiari malformation (HCC), two MRIs stable    Gestational thrombocytopenia    Hyperprolactinemia (cabergoline  twice weekly)    Hypertension (?gestational, on no meds currently)    Menstrual migraine     Past Surgical History:  Procedure Laterality Date   NO PAST SURGERIES      Family History  Problem Relation Age of Onset   Diabetes Maternal Grandmother    Hypertension Maternal Grandmother    Healthy Mother    Healthy Father    Bladder Cancer Maternal Grandfather    Diabetes Paternal Grandmother    Heart attack Paternal Grandfather     Social History[1]  Medications: - cabergoline  0.25 mg twice weekly - duloxetine  30 mg every day - vitamin D  1000 U every day - patient denies taking blood thinning medications  Allergies[2]   ROS:  Pertinent items noted in HPI and remainder of comprehensive ROS otherwise negative.  Blood pressure 125/70, pulse 75, temperature 98.7 F (37.1 C), temperature source Oral, resp. rate 18, height 5' 7 (1.702 m), weight 89.4 kg, last menstrual period 07/09/2024, SpO2 97%, unknown if currently breastfeeding. Physical Exam Constitutional:      General: She is not in acute distress.    Appearance: She is not toxic-appearing.  Cardiovascular:     Rate and Rhythm: Normal rate and regular rhythm.     Heart sounds: No murmur heard.    No friction rub. No gallop.  Pulmonary:     Effort: Pulmonary effort is normal. No  respiratory distress.     Breath sounds: Normal breath sounds.  Abdominal:     General: There is no distension.     Palpations: Abdomen is soft. There is no shifting dullness or mass.     Tenderness: There is abdominal tenderness in the right lower quadrant and periumbilical area. There is no guarding or rebound. Negative signs include Rovsing's sign, McBurney's sign and  psoas sign.  Neurological:     General: No focal deficit present.     Mental Status: She is alert.  Psychiatric:        Mood and Affect: Mood normal.        Behavior: Behavior normal.     Results: Results for orders placed or performed during the hospital encounter of 07/23/24 (from the past 48 hours)  Lipase, blood     Status: None   Collection Time: 07/23/24  7:07 PM  Result Value Ref Range   Lipase 21 11 - 51 U/L    Comment: Performed at [facility], [address], [place], [place] [address]  Comprehensive metabolic panel     Status: Abnormal   Collection Time: 07/23/24  7:07 PM  Result Value Ref Range   Sodium 139 135 - 145 mmol/L   Potassium 4.5 3.5 - 5.1 mmol/L   Chloride 103 98 - 111 mmol/L   CO2 27 22 - 32 mmol/L   Glucose, Bld 82 70 - 99 mg/dL    Comment: Glucose reference range applies only to samples taken after fasting for at least 8 hours.   BUN 12 6 - 20 mg/dL   Creatinine, Ser 9.24 0.44 - 1.00 mg/dL   Calcium 9.5 8.9 - 89.6 mg/dL   Total Protein 7.2 6.5 - 8.1 g/dL   Albumin 4.6 3.5 - 5.0 g/dL   AST 14 (L) 15 - 41 U/L   ALT 12 0 - 44 U/L   Alkaline Phosphatase 62 38 - 126 U/L   Total Bilirubin 0.3 0.0 - 1.2 mg/dL   GFR, Estimated >39 >39 mL/min    Comment: (NOTE) Calculated using the CKD-EPI Creatinine Equation (2021)    Anion gap 9 5 - 15    Comment: Performed at [facility], [address], [place], [place] [address]  CBC     Status: None   Collection Time: 07/23/24  7:07 PM  Result Value Ref Range   WBC 9.2 4.0 - 10.5 K/uL   RBC 4.85 3.87 - 5.11 MIL/uL   Hemoglobin 13.6 12.0 - 15.0 g/dL   HCT 58.5 63.9 - 53.9 %   MCV 85.4 80.0 - 100.0 fL   MCH 28.0 26.0 - 34.0 pg   MCHC 32.9 30.0 - 36.0 g/dL   RDW 87.0 88.4 - 84.4 %   Platelets 189 150 - 400 K/uL   nRBC 0.0 0.0 - 0.2 %    Comment: Performed at [facility], [address], [place], [place] [address]  hCG, quantitative, pregnancy     Status: None   Collection Time: 07/23/24  7:07 PM  Result  Value Ref Range   hCG, Beta Chain, Quant, S <1 <5 mIU/mL    Comment:          GEST. AGE      CONC.  (mIU/mL)   <=1 WEEK        5 - 50     2 WEEKS       50 - 500     3 WEEKS       100 -  10,000     4 WEEKS     1,000 - 30,000     5 WEEKS     3,500 - 115,000   6-8 WEEKS     12,000 - 270,000    12 WEEKS     15,000 - 220,000        FEMALE AND NON-PREGNANT FEMALE:     LESS THAN 5 mIU/mL Performed at [facility], [address] [place], [place] [address]   POC urine preg, ED     Status: None   Collection Time: 07/23/24 10:28 PM  Result Value Ref Range   Preg Test, Ur NEGATIVE NEGATIVE    Comment:        THE SENSITIVITY OF THIS METHODOLOGY IS >20 mIU/mL.   Urinalysis, Routine w reflex microscopic -Urine, Clean Catch     Status: Abnormal   Collection Time: 07/23/24 10:30 PM  Result Value Ref Range   Color, Urine YELLOW YELLOW   APPearance HAZY (A) CLEAR   Specific Gravity, Urine 1.018 1.005 - 1.030   pH 5.0 5.0 - 8.0   Glucose, UA NEGATIVE NEGATIVE mg/dL   Hgb urine dipstick NEGATIVE NEGATIVE   Bilirubin Urine NEGATIVE NEGATIVE   Ketones, ur NEGATIVE NEGATIVE mg/dL   Protein, ur NEGATIVE NEGATIVE mg/dL   Nitrite NEGATIVE NEGATIVE   Leukocytes,Ua MODERATE (A) NEGATIVE   RBC / HPF 0-5 0 - 5 RBC/hpf   WBC, UA 6-10 0 - 5 WBC/hpf   Bacteria, UA FEW (A) NONE SEEN   Squamous Epithelial / HPF 0-5 0 - 5 /HPF   Mucus PRESENT     Comment: Performed at [facility], [address], [place], [place] [address]  Comprehensive metabolic panel     Status: Abnormal   Collection Time: 07/24/24  4:14 AM  Result Value Ref Range   Sodium 139 135 - 145 mmol/L   Potassium 3.9 3.5 - 5.1 mmol/L   Chloride 105 98 - 111 mmol/L   CO2 26 22 - 32 mmol/L   Glucose, Bld 96 70 - 99 mg/dL    Comment: Glucose reference range applies only to samples taken after fasting for at least 8 hours.   BUN 9 6 - 20 mg/dL   Creatinine, Ser 9.34 0.44 - 1.00 mg/dL   Calcium 8.9 8.9 - 89.6 mg/dL   Total Protein 6.7 6.5 -  8.1 g/dL   Albumin 4.3 3.5 - 5.0 g/dL   AST 12 (L) 15 - 41 U/L   ALT 10 0 - 44 U/L   Alkaline Phosphatase 58 38 - 126 U/L   Total Bilirubin 0.2 0.0 - 1.2 mg/dL   GFR, Estimated >39 >39 mL/min    Comment: (NOTE) Calculated using the CKD-EPI Creatinine Equation (2021)    Anion gap 9 5 - 15    Comment: Performed at [facility], [address] [place], [place] [address]  CBC     Status: Abnormal   Collection Time: 07/24/24  4:14 AM  Result Value Ref Range   WBC 6.1 4.0 - 10.5 K/uL   RBC 4.46 3.87 - 5.11 MIL/uL   Hemoglobin 12.5 12.0 - 15.0 g/dL   HCT 62.4 63.9 - 53.9 %   MCV 84.1 80.0 - 100.0 fL   MCH 28.0 26.0 - 34.0 pg   MCHC 33.3 30.0 - 36.0 g/dL   RDW 86.9 88.4 - 84.4 %   Platelets 141 (L) 150 - 400 K/uL   nRBC 0.0 0.0 - 0.2 %    Comment: Performed  at [facility], [address] [place], [place] [address]  Magnesium      Status: None   Collection Time: 07/24/24  4:14 AM  Result Value Ref Range   Magnesium  2.3 1.7 - 2.4 mg/dL    Comment: Performed at [facility], [address], [place], [place] [address]  Phosphorus     Status: None   Collection Time: 07/24/24  4:14 AM  Result Value Ref Range   Phosphorus 3.4 2.5 - 4.6 mg/dL    Comment: Performed at [facility], [address], [place], [place] [address]    CT ABDOMEN PELVIS W CONTRAST Result Date: 07/23/2024 EXAM: CT ABDOMEN AND PELVIS WITH CONTRAST 07/23/2024 10:57:45 PM TECHNIQUE: CT of the abdomen and pelvis was performed with the administration of 100 mL of iohexol  (OMNIPAQUE ) 300 MG/ML solution. Multiplanar reformatted images are provided for review. Automated exposure control, iterative reconstruction, and/or weight-based adjustment of the mA/kV was utilized to reduce the radiation dose to as low as reasonably achievable. COMPARISON: None available. CLINICAL HISTORY: Abdominal pain, acute, nonlocalized. FINDINGS: LOWER CHEST: No acute abnormality. LIVER: The liver is unremarkable. GALLBLADDER AND BILE DUCTS: Gallbladder  is unremarkable. No biliary ductal dilatation. SPLEEN: No acute abnormality. PANCREAS: No acute abnormality. ADRENAL GLANDS: No acute abnormality. KIDNEYS, URETERS AND BLADDER: No stones in the kidneys or ureters. No hydronephrosis. No perinephric or periureteral stranding. Urinary bladder is unremarkable. GI AND BOWEL: Stomach demonstrates no acute abnormality. There is no bowel obstruction. The distal appendix is normal (image 49). The proximal appendix is mildly dilated, measuring 11 mm, with suspected subtle periappendiceal stranding (image 46). This appearance is equivocal but raises concern for very mild early acute appendicitis. PERITONEUM AND RETROPERITONEUM: Small volume pelvic ascites, likely physiologic. No free air. VASCULATURE: Aorta is normal in caliber. LYMPH NODES: No lymphadenopathy. REPRODUCTIVE ORGANS: The uterus and bilateral ovaries are within normal limits. BONES AND SOFT TISSUES: Small fat-containing right paramidline ventral hernia (image 32). No acute osseous abnormality. IMPRESSION: 1. Possible mild early acute appendicitis, although equivocal. Electronically signed by: Pinkie Pebbles MD MD 07/23/2024 11:11 PM EST RP Workstation: [id number]    Assessment & Plan:  Glenda Marsh is a 34 y.o. female with progressive periumbilical and RLQ pain, anorexia, nausea, and equivocal radiologic evidence of possible mild early acute appendicitis. Alvarado score 5 consistent with possible appendicitis. - piperacillin -tazobactam (ZOSYN ) IVPB 3.375 g given - LR mIVF - PRN analgesia and Zofran  ordered - NPO since 0001 07/24/24 - Consent for appendectomy, robotic laparoscopic  All questions were answered to the satisfaction of the patient.  The risk and benefits of robotic appendectomy were discussed including but not limited to bleeding, infection, damage to nearby bowel and other local structures, and more.  After careful consideration, Glenda Marsh has decided to pursue robotic  appendectomy over antibiotic treatment alone.  Jayson LELON Ness MS3 07/24/2024, 7:37 AM           [1]  Social History Tobacco Use   Smoking status: Never   Smokeless tobacco: Never  Substance Use Topics   Alcohol use: Not Currently   Drug use: Never  [2] No Known Allergies

## 2024-07-24 NOTE — Transfer of Care (Signed)
 Immediate Anesthesia Transfer of Care Note  Patient: Glenda Marsh  Procedure(s) Performed: APPENDECTOMY, ROBOT-ASSISTED, LAPAROSCOPIC (Abdomen)  Patient Location: PACU  Anesthesia Type:General  Level of Consciousness: awake  Airway & Oxygen Therapy: Patient Spontanous Breathing  Post-op Assessment: Report given to RN  Post vital signs: Reviewed and stable  Last Vitals:  Vitals Value Taken Time  BP 118/82 07/24/24 15:11  Temp 37.1 C 07/24/24 15:11  Pulse 89 07/24/24 15:13  Resp 21 07/24/24 15:13  SpO2 99 % 07/24/24 15:13  Vitals shown include unfiled device data.  Last Pain:  Vitals:   07/24/24 1258  TempSrc:   PainSc: 0-No pain         Complications: No notable events documented.

## 2024-07-24 NOTE — TOC CM/SW Note (Signed)
 Transition of Care [facility]) - Inpatient Brief Assessment   Patient Details  Name: Glenda Marsh MRN: [id number] Date of Birth: 09-Jan-1991  Transition of Care [facility]) CM/SW Contact:    Lucie Lunger, LCSWA Phone Number: 07/24/2024, 8:19 AM   Clinical Narrative: Transition of Care Department [facility]) has reviewed patient and no TOC needs have been identified at this time. We will continue to monitor patient advancement through interdiciplinary progression rounds. If new patient transition needs arise, please place a TOC consult.  Transition of Care Asessment: Insurance and Status: Insurance coverage has been reviewed Patient has primary care physician: Yes Home environment has been reviewed: From home Prior level of function:: Independent Prior/Current Home Services: No current home services Social Drivers of Health Review: SDOH reviewed no interventions necessary Readmission risk has been reviewed: Yes Transition of care needs: no transition of care needs at this time

## 2024-07-24 NOTE — ED Notes (Signed)
 All jewelry and belongings ina bag and husband will take responsibility for them.

## 2024-07-24 NOTE — Progress Notes (Addendum)
 " PROGRESS NOTE    Glenda Marsh  FMW:[id number] DOB: [birth year] DOA: 07/23/2024 PCP: Toribio Jerel MATSU, MD   Brief Narrative:  HPI: [name] is a 34 y.o. female with medical history significant of anxiety who presents to the emergency department due to 1 day onset of abdominal pain which started in the umbilical area and has since migrated to right lower abdominal area.  She went to see her PCP who suspected that she may be having appendicitis, so she was asked to go to the ED for further evaluation.  Abdominal pain was associated with nausea, but she denied vomiting, fever, headache.   ED course In the emergency department, she was hemodynamically stable.  CBC and BMP was normal.  Urinalysis was normal. CT abdomen pelvis with contrast was suggestive of possible mild early acute appendicitis. She was treated with ceftriaxone .  General surgery (Dr. Kallie) was consulted and recommended admitting the patient with plan to take patient to the OR in the morning. [facility] was asked to admit patient.  Assessment & Plan:   Principal Problem:   Acute appendicitis  ?  Acute appendicitis: Still with same pain, no improvement.  Continue Zosyn  and pain management as well as symptomatic treatment with Zofran  for possible nausea vomiting.  Awaiting general surgery/Dr. Kallie to see.  Per notes, she is posted for possible appendectomy today.  Anxiety: Resume Cymbalta .  Chronic/intermittent thrombocytopenia: No signs of bleeding.  Repeat CBC in the morning.  DVT prophylaxis: SCDs Start: 07/23/24 2338   Code Status: Full Code  Family Communication: Husband present at bedside.  Plan of care discussed with patient in length and he/she verbalized understanding and agreed with it.  Status is: Observation The patient remains OBS appropriate and will d/c before 2 midnights.   Estimated body mass index is 30.85 kg/m as calculated from the following:   Height as of this encounter: 5' 7 (1.702 m).    Weight as of this encounter: 89.4 kg.    Nutritional Assessment: Body mass index is 30.85 kg/m.SABRA Seen by dietician.  I agree with the assessment and plan as outlined below: Nutrition Status:        . Skin Assessment: I have examined the patient's skin and I agree with the wound assessment as performed by the wound care RN as outlined below:    Consultants:  General surgery  Procedures:  None  Antimicrobials:  Anti-infectives (From admission, onward)    Start     Dose/Rate Route Frequency Ordered Stop   07/24/24 0600  piperacillin -tazobactam (ZOSYN ) IVPB 3.375 g        3.375 g 12.5 mL/hr over 240 Minutes Intravenous Every 8 hours 07/24/24 0252     07/23/24 2330  cefTRIAXone  (ROCEPHIN ) 2 g in sodium chloride  0.9 % 100 mL IVPB        2 g 200 mL/hr over 30 Minutes Intravenous  Once 07/23/24 2325 07/24/24 0008         Subjective: Seen and examined, still with right lower quadrant pain.  No other complaint.  Objective: Vitals:   07/24/24 0600 07/24/24 0715 07/24/24 0815 07/24/24 0845  BP: 121/71 125/70 121/65 117/62  Pulse: 75 75 76 77  Resp: 18 15    Temp:      TempSrc:      SpO2: 99% 97% 99% 100%  Weight:      Height:       No intake or output data in the 24 hours ending 07/24/24 0851 [employer]  07/23/24 1818  Weight: 89.4 kg    Examination:  General exam: Appears calm and comfortable  Respiratory system: Clear to auscultation. Respiratory effort normal. Cardiovascular system: S1 & S2 heard, RRR. No JVD, murmurs, rubs, gallops or clicks. No pedal edema. Gastrointestinal system: Abdomen is nondistended, soft and right lower quadrant tenderness. No organomegaly or masses felt. Normal bowel sounds heard. Central nervous system: Alert and oriented. No focal neurological deficits. Extremities: Symmetric 5 x 5 power. Skin: No rashes, lesions or ulcers Psychiatry: Judgement and insight appear normal. Mood & affect appropriate.    Data Reviewed: I have  personally reviewed following labs and imaging studies  CBC: Recent Labs  Lab 07/23/24 1907 07/24/24 0414  WBC 9.2 6.1  HGB 13.6 12.5  HCT 41.4 37.5  MCV 85.4 84.1  PLT 189 141*   Basic Metabolic Panel: Recent Labs  Lab 07/23/24 1907 07/24/24 0414  NA 139 139  K 4.5 3.9  CL 103 105  CO2 27 26  GLUCOSE 82 96  BUN 12 9  CREATININE 0.75 0.65  CALCIUM 9.5 8.9  MG  --  2.3  PHOS  --  3.4   GFR: Estimated Creatinine Clearance: 114.8 mL/min (by C-G formula based on SCr of 0.65 mg/dL). Liver Function Tests: Recent Labs  Lab 07/23/24 1907 07/24/24 0414  AST 14* 12*  ALT 12 10  ALKPHOS 62 58  BILITOT 0.3 0.2  PROT 7.2 6.7  ALBUMIN 4.6 4.3   Recent Labs  Lab 07/23/24 1907  LIPASE 21   No results for input(s): AMMONIA in the last 168 hours. Coagulation Profile: No results for input(s): INR, PROTIME in the last 168 hours. Cardiac Enzymes: No results for input(s): CKTOTAL, CKMB, CKMBINDEX, TROPONINI in the last 168 hours. BNP (last 3 results) No results for input(s): PROBNP in the last 8760 hours. HbA1C: No results for input(s): HGBA1C in the last 72 hours. CBG: No results for input(s): GLUCAP in the last 168 hours. Lipid Profile: No results for input(s): CHOL, HDL, LDLCALC, TRIG, CHOLHDL, LDLDIRECT in the last 72 hours. Thyroid  Function Tests: No results for input(s): TSH, T4TOTAL, FREET4, T3FREE, THYROIDAB in the last 72 hours. Anemia Panel: No results for input(s): VITAMINB12, FOLATE, FERRITIN, TIBC, IRON , RETICCTPCT in the last 72 hours. Sepsis Labs: No results for input(s): PROCALCITON, LATICACIDVEN in the last 168 hours.  No results found for this or any previous visit (from the past 240 hours).   Radiology Studies: CT ABDOMEN PELVIS W CONTRAST Result Date: 07/23/2024 EXAM: CT ABDOMEN AND PELVIS WITH CONTRAST 07/23/2024 10:57:45 PM TECHNIQUE: CT of the abdomen and pelvis was performed with the  administration of 100 mL of iohexol  (OMNIPAQUE ) 300 MG/ML solution. Multiplanar reformatted images are provided for review. Automated exposure control, iterative reconstruction, and/or weight-based adjustment of the mA/kV was utilized to reduce the radiation dose to as low as reasonably achievable. COMPARISON: None available. CLINICAL HISTORY: Abdominal pain, acute, nonlocalized. FINDINGS: LOWER CHEST: No acute abnormality. LIVER: The liver is unremarkable. GALLBLADDER AND BILE DUCTS: Gallbladder is unremarkable. No biliary ductal dilatation. SPLEEN: No acute abnormality. PANCREAS: No acute abnormality. ADRENAL GLANDS: No acute abnormality. KIDNEYS, URETERS AND BLADDER: No stones in the kidneys or ureters. No hydronephrosis. No perinephric or periureteral stranding. Urinary bladder is unremarkable. GI AND BOWEL: Stomach demonstrates no acute abnormality. There is no bowel obstruction. The distal appendix is normal (image 49). The proximal appendix is mildly dilated, measuring 11 mm, with suspected subtle periappendiceal stranding (image 46). This appearance is equivocal but raises concern for  very mild early acute appendicitis. PERITONEUM AND RETROPERITONEUM: Small volume pelvic ascites, likely physiologic. No free air. VASCULATURE: Aorta is normal in caliber. LYMPH NODES: No lymphadenopathy. REPRODUCTIVE ORGANS: The uterus and bilateral ovaries are within normal limits. BONES AND SOFT TISSUES: Small fat-containing right paramidline ventral hernia (image 32). No acute osseous abnormality. IMPRESSION: 1. Possible mild early acute appendicitis, although equivocal. Electronically signed by: Pinkie Pebbles MD MD 07/23/2024 11:11 PM EST RP Workstation: [id number]    Scheduled Meds: Continuous Infusions:  lactated ringers  1,000 mL (07/24/24 0239)   piperacillin -tazobactam (ZOSYN )  IV 3.375 g (07/24/24 0557)     LOS: 1 day   Fredia Skeeter, MD [facility]  07/24/2024, 8:51 AM   *Please note that this  is a verbal dictation therefore any spelling or grammatical errors are due to the Dragon Medical One system interpretation.  Please page via Amion and do not message via secure chat for urgent patient care matters. Secure chat can be used for non urgent patient care matters.  How to contact the [facility] Attending or Consulting provider 7A - 7P or covering provider during after hours 7P -7A, for this patient?  Check the care team in [facility] and look for a) attending/consulting [facility] provider listed and b) the [facility] team listed. Page or secure chat 7A-7P. Log into [email or web address] and use [facility] universal password to access. If you do not have the password, please contact the hospital operator. Locate the [facility] provider you are looking for under [facility] and page to a number that you can be directly reached. If you still have difficulty reaching the provider, please page the [facility] (Director on Call) for the Hospitalists listed on amion for assistance.  "

## 2024-07-24 NOTE — Anesthesia Preprocedure Evaluation (Signed)
"                                    Anesthesia Evaluation  Patient identified by MRN, date of birth, ID band Patient awake    Reviewed: Allergy & Precautions, H&P , NPO status , Patient's Chart, lab work & pertinent test results, reviewed documented beta blocker date and time   Airway Mallampati: II  TM Distance: >3 FB Neck ROM: full    Dental no notable dental hx.    Pulmonary neg pulmonary ROS   Pulmonary exam normal breath sounds clear to auscultation       Cardiovascular Exercise Tolerance: Good hypertension,  Rhythm:regular Rate:Normal     Neuro/Psych  Headaches  Anxiety      negative psych ROS   GI/Hepatic negative GI ROS, Neg liver ROS,,,  Endo/Other  negative endocrine ROS    Renal/GU negative Renal ROS  negative genitourinary   Musculoskeletal   Abdominal   Peds  Hematology negative hematology ROS (+)   Anesthesia Other Findings   Reproductive/Obstetrics negative OB ROS                              Anesthesia Physical Anesthesia Plan  ASA: 2 and emergent  Anesthesia Plan: General and General ETT   Post-op Pain Management:    Induction:   PONV Risk Score and Plan: Ondansetron   Airway Management Planned:   Additional Equipment:   Intra-op Plan:   Post-operative Plan:   Informed Consent: I have reviewed the patients History and Physical, chart, labs and discussed the procedure including the risks, benefits and alternatives for the proposed anesthesia with the patient or authorized representative who has indicated his/her understanding and acceptance.     Dental Advisory Given  Plan Discussed with: CRNA  Anesthesia Plan Comments:         Anesthesia Quick Evaluation  "

## 2024-07-24 NOTE — Discharge Instructions (Signed)
 Discharge Robotic Assisted Laparoscopic Surgery Instructions:  Common Complaints: Right shoulder pain is common after laparoscopic surgery.  This is secondary to the gas used in the surgery being trapped under the diaphragm.  Walk to help your body absorb the gas. This will improve in a few days. Pain at the port sites are common, especially the larger port sites. This will improve with time.  Some nausea is common and poor appetite. The main goal is to stay hydrated the first few days after surgery.   Diet/ Activity: Diet as tolerated. You may not have an appetite, but it is important to stay hydrated.  Drink 64 ounces of water  a day. Your appetite will return with time.  Shower per your regular routine daily.  Do not take hot showers. Take warm showers that are less than 10 minutes. Rest and listen to your body, but do not remain in bed all day.  Walk everyday for at least 15-20 minutes. Deep cough and move around every 1-2 hours in the first few days after surgery.  Do not lift > 10 lbs, perform excessive bending, pushing, pulling, squatting for 1-2 weeks after surgery.  Do not pick at the dermabond glue on your incision sites.  This glue film will remain in place for 1-2 weeks and will start to peel off.  Do not place lotions or balms on your incision unless instructed to specifically by Dr. Kallie.   Pain Expectations and Narcotics: -After surgery you will have pain associated with your incisions and this is normal. The pain is muscular and nerve pain, and will get better with time. -You are encouraged and expected to take non narcotic medications like tylenol  and ibuprofen  (when able) to treat pain as multiple modalities can aid with pain treatment. -Narcotics are only used when pain is severe or there is breakthrough pain. -You are not expected to have a pain score of 0 after surgery, as we cannot prevent pain. A pain score of 3-4 that allows you to be functional, move, walk, and  tolerate some activity is the goal. The pain will continue to improve over the days after surgery and is dependent on your surgery. -Due to [place] law, we are only able to give a certain amount of pain medication to treat post operative pain, and we only give additional narcotics on a patient by patient basis.  -For most laparoscopic surgery, studies have shown that the majority of patients only need 10-15 narcotic pills, and for open surgeries most patients only need 15-20.   -Having appropriate expectations of pain and knowledge of pain management with non narcotics is important as we do not want anyone to become addicted to narcotic pain medication.  -Using ice packs in the first 48 hours and heating pads after 48 hours, wearing an abdominal binder (when recommended), and using over the counter medications are all ways to help with pain management.   -Simple acts like meditation and mindfulness practices after surgery can also help with pain control and research has proven the benefit of these practices.  Medication: Take tylenol  and ibuprofen  as needed for pain control, alternating every 4-6 hours.  Example:  Tylenol  1000mg  @ 6am, 12noon, 6pm, (Do not exceed 4000mg  of tylenol  a day). Ibuprofen  800mg  @ 9am, 3pm, 9pm, 3am (Do not exceed 3600mg  of ibuprofen  a day).  Take Roxicodone  for breakthrough pain every 4 hours.  Take Colace for constipation related to narcotic pain medication. If you do not have a bowel movement in  2 days, take Miralax over the counter.  Drink plenty of water  to also prevent constipation.   Contact Information: If you have questions or concerns, please call our office, [phone], Monday- Thursday 8AM-5PM and Friday 8AM-12Noon.  If it is after hours or on the weekend, please call [facility] Main Number, [phone], [phone], and ask to speak to the surgeon on call for Dr. Kallie at [facility].

## 2024-07-24 NOTE — Progress Notes (Signed)
 Right upper lip slightly swollen, patient given ice. Swelling improved after ice application. Pt tolerating po fluids

## 2024-07-24 NOTE — Progress Notes (Signed)
 [facility]   Updated husband. Acute appendicitis. Will see if she wants to go home or stay. Rx to the Drug store.    Manuelita Pander, MD [facility] [address] [place], [place] [address] [phone] (office)

## 2024-07-24 NOTE — Anesthesia Procedure Notes (Signed)
 Procedure Name: Intubation Date/Time: 07/24/2024 2:09 PM  Performed by: Toribio Darice B[place], CRNAPre-anesthesia Checklist: Patient identified, Patient being monitored, Timeout performed, Emergency Drugs available and Suction available Patient Re-evaluated:Patient Re-evaluated prior to induction Oxygen Delivery Method: Circle System Utilized Preoxygenation: Pre-oxygenation with 100% oxygen Induction Type: IV induction Ventilation: Mask ventilation without difficulty Laryngoscope Size: Mac and 3 Grade View: Grade I Tube type: Oral Tube size: 7.0 mm Number of attempts: 1 Airway Equipment and Method: stylet Placement Confirmation: ETT inserted through vocal cords under direct vision, positive ETCO2 and breath sounds checked- equal and bilateral Secured at: 21 cm Tube secured with: Tape Dental Injury: Teeth and Oropharynx as per pre-operative assessment

## 2024-07-25 LAB — URINE CULTURE: Culture: 20000 — AB

## 2024-07-26 ENCOUNTER — Ambulatory Visit: Payer: Self-pay | Admitting: General Surgery

## 2024-07-26 NOTE — Discharge Summary (Signed)
 Physician Discharge Summary  Patient ID: Glenda Marsh MRN: [id number] DOB/AGE: 34/10/1990 34 y.o.  Admit date: 07/23/2024 Discharge date: 07/24/24  Admission Diagnoses: Acute appendicitis   Discharge Diagnoses:  Principal Problem:   Acute appendicitis   Discharged Condition: good  Hospital Course: Glenda Marsh is a 34 yo with acute appendicitis found overnight who was admitted by the hospitalist for OR the next day. I took her over and she had her robotic assisted appendectomy and did well. She went home the day of 07/24/2024 after surgery. She had pain control and tolerated liquids.   Consults: Hospitalist admission taken over by surgery  Significant Diagnostic Studies:  CT-acute appendicitis   Treatments: IV hydration, antibiotics: ceftriaxone , and surgery: robotic assisted appendectomy 07/24/2024  Discharge Exam: Blood pressure 121/68, pulse 88, temperature 98.3 F (36.8 C), temperature source Oral, resp. rate 18, height 5' 7 (1.702 m), weight 89.4 kg, last menstrual period 07/09/2024, SpO2 98%, unknown if currently breastfeeding.   Disposition:    Allergies as of 07/24/2024   No Known Allergies      Medication List     TAKE these medications    cabergoline  0.5 MG tablet Commonly known as: DOSTINEX  Take 0.5 tablets (0.25 mg total) by mouth 2 (two) times a week.   cholecalciferol 25 MCG (1000 UNIT) tablet Commonly known as: VITAMIN D3 Take 1,000 Units by mouth daily.   DULoxetine  30 MG capsule Commonly known as: CYMBALTA  Take 30 mg by mouth daily.   ondansetron  4 MG tablet Commonly known as: Zofran  Take 1 tablet (4 mg total) by mouth every 8 (eight) hours as needed.   oxyCODONE  5 MG immediate release tablet Commonly known as: Roxicodone  Take 1 tablet (5 mg total) by mouth every 4 (four) hours as needed for severe pain (pain score 7-10) or breakthrough pain.        Follow-up Information     Kallie Manuelita BROCKS, MD Follow up on 08/12/2024.   Specialty: General  Surgery Why: phone call, if you need to be seen in person call the office Contact information: [address] [place] [facility] [address] [phone]                 Signed: Manuelita BROCKS Kallie 07/26/2024, 11:53 AM

## 2024-07-27 ENCOUNTER — Encounter (HOSPITAL_COMMUNITY): Payer: Self-pay | Admitting: General Surgery

## 2024-07-27 NOTE — Anesthesia Postprocedure Evaluation (Signed)
"   Anesthesia Post Note  Patient: Glenda Marsh  Procedure(s) Performed: APPENDECTOMY, ROBOT-ASSISTED, LAPAROSCOPIC (Abdomen)  Patient location during evaluation: Phase II Anesthesia Type: General Level of consciousness: awake Pain management: pain level controlled Vital Signs Assessment: post-procedure vital signs reviewed and stable Respiratory status: spontaneous breathing and respiratory function stable Cardiovascular status: blood pressure returned to baseline and stable Postop Assessment: no headache and no apparent nausea or vomiting Anesthetic complications: no Comments: Late entry   No notable events documented.   Last Vitals:  Vitals:   07/24/24 1600 07/24/24 1614  BP: 129/73 121/68  Pulse: 86 88  Resp: 19 18  Temp:  36.8 C  SpO2: 100% 98%    Last Pain:  Vitals:   07/24/24 1614  TempSrc: Oral  PainSc: 6                  Yvonna PARAS Maelynn Moroney      "

## 2024-07-28 LAB — SURGICAL PATHOLOGY

## 2024-08-12 ENCOUNTER — Ambulatory Visit (INDEPENDENT_AMBULATORY_CARE_PROVIDER_SITE_OTHER): Admitting: General Surgery

## 2024-08-12 DIAGNOSIS — K353 Acute appendicitis with localized peritonitis, without perforation or gangrene: Secondary | ICD-10-CM

## 2024-08-12 NOTE — Progress Notes (Signed)
 [facility]  I am calling the patient for post operative evaluation. This is not a billable encounter as it is under the global charges for the surgery.  The patient had a robotic appendectomy on 07/24/24. The patient reports that she is doing well. The are tolerating a diet, having good pain control, and having regular Bms.  The incisions are healing well and some glue is still peeling off. The patient has no concerns.   Pathology: Acute appendicitis with periappendicitis.  Negative for malignancy   Will see the patient PRN.  Diet and activity as tolerated.   Manuelita Pander, MD [facility] [address] [place], [place] [address] [phone] (office)

## 2024-08-20 ENCOUNTER — Encounter: Payer: Self-pay | Admitting: Internal Medicine

## 2025-03-05 ENCOUNTER — Ambulatory Visit: Admitting: Internal Medicine

## 2025-03-08 ENCOUNTER — Ambulatory Visit: Admitting: Internal Medicine
